# Patient Record
Sex: Female | Born: 1935 | Hispanic: No | Smoking: Former smoker
Health system: Southern US, Community
[De-identification: ages and names within clinical notes are randomized; demographics above are authoritative.]

## PROBLEM LIST (undated history)

## (undated) DIAGNOSIS — K219 Gastro-esophageal reflux disease without esophagitis: Secondary | ICD-10-CM

## (undated) DIAGNOSIS — Z8679 Personal history of other diseases of the circulatory system: Secondary | ICD-10-CM

## (undated) DIAGNOSIS — K279 Peptic ulcer, site unspecified, unspecified as acute or chronic, without hemorrhage or perforation: Secondary | ICD-10-CM

## (undated) DIAGNOSIS — J309 Allergic rhinitis, unspecified: Secondary | ICD-10-CM

## (undated) DIAGNOSIS — J449 Chronic obstructive pulmonary disease, unspecified: Secondary | ICD-10-CM

## (undated) DIAGNOSIS — K2270 Barrett's esophagus without dysplasia: Secondary | ICD-10-CM

## (undated) DIAGNOSIS — E039 Hypothyroidism, unspecified: Secondary | ICD-10-CM

## (undated) DIAGNOSIS — I4891 Unspecified atrial fibrillation: Principal | ICD-10-CM

## (undated) DIAGNOSIS — I10 Essential (primary) hypertension: Secondary | ICD-10-CM

## (undated) DIAGNOSIS — J45909 Unspecified asthma, uncomplicated: Secondary | ICD-10-CM

## (undated) DIAGNOSIS — E785 Hyperlipidemia, unspecified: Secondary | ICD-10-CM

## (undated) DIAGNOSIS — M1990 Unspecified osteoarthritis, unspecified site: Secondary | ICD-10-CM

## (undated) DIAGNOSIS — I2510 Atherosclerotic heart disease of native coronary artery without angina pectoris: Secondary | ICD-10-CM

## (undated) DIAGNOSIS — K8020 Calculus of gallbladder without cholecystitis without obstruction: Secondary | ICD-10-CM

## (undated) HISTORY — DX: Hyperlipidemia, unspecified: E78.5

## (undated) HISTORY — DX: Gastro-esophageal reflux disease without esophagitis: K21.9

## (undated) HISTORY — DX: Allergic rhinitis, unspecified: J30.9

## (undated) HISTORY — DX: Chronic obstructive pulmonary disease, unspecified: J44.9

## (undated) HISTORY — DX: Unspecified asthma, uncomplicated: J45.909

## (undated) HISTORY — PX: BREAST BIOPSY: SHX20

## (undated) HISTORY — PX: CORONARY STENT PLACEMENT: SHX1402

## (undated) HISTORY — PX: CHOLECYSTECTOMY: SHX55

## (undated) HISTORY — DX: Unspecified atrial fibrillation: I48.91

## (undated) HISTORY — DX: Unspecified osteoarthritis, unspecified site: M19.90

## (undated) HISTORY — DX: Atherosclerotic heart disease of native coronary artery without angina pectoris: I25.10

## (undated) HISTORY — PX: APPENDECTOMY: SHX54

## (undated) HISTORY — PX: VESICOVAGINAL FISTULA CLOSURE W/ TAH: SUR271

## (undated) HISTORY — DX: Personal history of other diseases of the circulatory system: Z86.79

## (undated) HISTORY — DX: Calculus of gallbladder without cholecystitis without obstruction: K80.20

## (undated) HISTORY — DX: Hypothyroidism, unspecified: E03.9

## (undated) HISTORY — DX: Peptic ulcer, site unspecified, unspecified as acute or chronic, without hemorrhage or perforation: K27.9

## (undated) HISTORY — DX: Barrett's esophagus without dysplasia: K22.70

## (undated) HISTORY — DX: Essential (primary) hypertension: I10

---

## 2009-10-05 ENCOUNTER — Encounter: Payer: Self-pay | Admitting: Internal Medicine

## 2010-04-14 ENCOUNTER — Ambulatory Visit: Payer: Self-pay | Admitting: Internal Medicine

## 2010-04-14 DIAGNOSIS — J4489 Other specified chronic obstructive pulmonary disease: Secondary | ICD-10-CM | POA: Insufficient documentation

## 2010-04-14 DIAGNOSIS — E039 Hypothyroidism, unspecified: Secondary | ICD-10-CM | POA: Insufficient documentation

## 2010-04-14 DIAGNOSIS — I10 Essential (primary) hypertension: Secondary | ICD-10-CM

## 2010-04-14 DIAGNOSIS — J449 Chronic obstructive pulmonary disease, unspecified: Secondary | ICD-10-CM

## 2010-04-14 DIAGNOSIS — Z8679 Personal history of other diseases of the circulatory system: Secondary | ICD-10-CM

## 2010-04-14 DIAGNOSIS — K219 Gastro-esophageal reflux disease without esophagitis: Secondary | ICD-10-CM | POA: Insufficient documentation

## 2010-04-14 DIAGNOSIS — I4891 Unspecified atrial fibrillation: Secondary | ICD-10-CM | POA: Insufficient documentation

## 2010-04-14 DIAGNOSIS — E785 Hyperlipidemia, unspecified: Secondary | ICD-10-CM

## 2010-04-14 DIAGNOSIS — J309 Allergic rhinitis, unspecified: Secondary | ICD-10-CM | POA: Insufficient documentation

## 2010-04-14 DIAGNOSIS — M1990 Unspecified osteoarthritis, unspecified site: Secondary | ICD-10-CM | POA: Insufficient documentation

## 2010-04-14 DIAGNOSIS — K279 Peptic ulcer, site unspecified, unspecified as acute or chronic, without hemorrhage or perforation: Secondary | ICD-10-CM

## 2010-04-14 DIAGNOSIS — I2510 Atherosclerotic heart disease of native coronary artery without angina pectoris: Secondary | ICD-10-CM

## 2010-04-14 HISTORY — DX: Atherosclerotic heart disease of native coronary artery without angina pectoris: I25.10

## 2010-04-14 HISTORY — DX: Unspecified osteoarthritis, unspecified site: M19.90

## 2010-04-14 HISTORY — DX: Essential (primary) hypertension: I10

## 2010-04-14 HISTORY — DX: Other specified chronic obstructive pulmonary disease: J44.89

## 2010-04-14 HISTORY — DX: Personal history of other diseases of the circulatory system: Z86.79

## 2010-04-14 HISTORY — DX: Peptic ulcer, site unspecified, unspecified as acute or chronic, without hemorrhage or perforation: K27.9

## 2010-04-14 HISTORY — DX: Hyperlipidemia, unspecified: E78.5

## 2010-04-14 HISTORY — DX: Allergic rhinitis, unspecified: J30.9

## 2010-04-14 HISTORY — DX: Chronic obstructive pulmonary disease, unspecified: J44.9

## 2010-04-14 HISTORY — DX: Hypothyroidism, unspecified: E03.9

## 2010-04-14 HISTORY — DX: Unspecified atrial fibrillation: I48.91

## 2010-04-14 HISTORY — DX: Gastro-esophageal reflux disease without esophagitis: K21.9

## 2010-06-06 NOTE — Letter (Signed)
Summary: PATIENT HX FORM  PATIENT HX FORM   Imported By: Georgian Co 04/14/2010 15:26:48  _____________________________________________________________________  External Attachment:    Type:   Image     Comment:   External Document

## 2010-06-06 NOTE — Assessment & Plan Note (Signed)
Summary: NEW PT EST (MEDICARE) // RS   Vital Signs:  Patient profile:   76 year old female Height:      63 inches Weight:      185 pounds BMI:     32.89 Temp:     98.0 degrees F oral BP sitting:   180 / 80  (right arm) Cuff size:   regular  Vitals Entered By: Duard Brady LPN (April 14, 2010 12:57 PM) CC: new to establish -  Is Patient Diabetic? No   CC:  new to establish - .  History of Present Illness: 76 year old patient who is in today to establish with our practice.  She lives in [place], and has an extensive cardiac history.  She has a history of coronary artery disease, status post stenting in 2009.  Apparently, she has paroxysmal H. refer ablation and had been on Coumadin until 2005 when she apparently had a small hemorrhagic stroke.  She has been on aspirin and Plavix since her stenting in 2009.  She states she has a history of ventricular tachycardia and syncope and has been considered for a pacemaker ( probably a ICD)  in the past.  She has arthritis.  Mr. depression, COPD.  She also has a history of peptic ulcer disease and gastroesophageal reflux disease and is on chronic PPI therapy.  This includes omeprazole;  medical regimen.  Also includes diltiazem and 80 mg of simvastatin.  Here for Medicare AWV:  1.   Risk factors based on Past M, S, F history:patient has known coronary artery disease, status post stenting.  She has hypertension and hypercholesterolemia 2.   Physical Activities: no activity restrictions 3.   Depression/mood: no history of depression or mood disorder 4.   Hearing: no deficits 5.   ADL's: independent in all aspects of daily living 6.   Fall Risk: low 7.   Home Safety: no problems identified 8.   Height, weight, &visual acuity:height and weight stable.  No difficulty with visual acuity 9.   Counseling: screening colonoscopy.  Encouraged 10.   Labs ordered based on risk factors: laboratory studies will be ordered in 3 months after  medication adjustment 11.           Referral Coordination- referred to [facility], GI for colonoscopy, and follow-up with [place].  Cardiology 12.           Care Plan- heart healthy diet, salt restricted encouraged 13.            Cognitive Assessment- alert and oriented, with normal affect.  No history of memory dysfunction  Preventive Screening-Counseling & Management  Alcohol-Tobacco     Smoking Status: quit  Allergies (verified): 1)  ! * Novohistine 2)  ! Sulfa 3)  ! Codeine 4)  ! Morphine 5)  ! Erythromycin  Past History:  Past Medical History: Atrial fibrillation COPD Coronary artery disease- status post stenting in February 2009 GERD Hyperlipidemia Hypertension Osteoarthritis Peptic ulcer disease Cerebrovascular accident, hx of small hemorrhagic stroke 2005 Allergic rhinitis Hypothyroidism overactive bladder  Past Surgical History: Appendectomy 1967 Cholecystectomy 1992  breast biopsy 1992 Hysterectomy 1979  flexible sigmoid,greater than  10 years ago colonoscopy never  Family History: Reviewed history and no changes required. father died age 66, MI mother died age 30, congestive heart failure two brothers, both deceased from apparently, pulmonary fibrosis 3 sisters, one deceased from bladder cancer  Social History: Reviewed history and no changes required. Occupation:  retired Scientist, research (physical sciences) [place] tobaccoSmoking Status:  quit  Review of Systems  The patient denies anorexia, fever, weight loss, weight gain, vision loss, decreased hearing, hoarseness, chest pain, syncope, dyspnea on exertion, peripheral edema, prolonged cough, headaches, hemoptysis, abdominal pain, melena, hematochezia, severe indigestion/heartburn, hematuria, incontinence, genital sores, muscle weakness, suspicious skin lesions, transient blindness, difficulty walking, depression, unusual weight change, abnormal bleeding, enlarged lymph nodes, angioedema, and breast masses.     Physical Exam  General:  overweight-appearing.  140/80, rhythm regular Head:  Normocephalic and atraumatic without obvious abnormalities. No apparent alopecia or balding. Eyes:  No corneal or conjunctival inflammation noted. EOMI. Perrla. Funduscopic exam benign, without hemorrhages, exudates or papilledema. Vision grossly normal. Ears:  External ear exam shows no significant lesions or deformities.  Otoscopic examination reveals clear canals, tympanic membranes are intact bilaterally without bulging, retraction, inflammation or discharge. Hearing is grossly normal bilaterally. Nose:  External nasal examination shows no deformity or inflammation. Nasal mucosa are pink and moist without lesions or exudates. Mouth:  Oral mucosa and oropharynx without lesions or exudates.  dentures in place Neck:  No deformities, masses, or tenderness noted. Chest Wall:  No deformities, masses, or tenderness noted. Breasts:  No mass, nodules, thickening, tenderness, bulging, retraction, inflamation, nipple discharge or skin changes noted.   Lungs:  Normal respiratory effort, chest expands symmetrically. Lungs are clear to auscultation, no crackles or wheezes. Heart:  Normal rate and regular rhythm. S1 and S2 normal without gallop, murmur, click, rub or other extra sounds.  rhythm is regular Abdomen:  Bowel sounds positive,abdomen soft and non-tender without masses, organomegaly or hernias noted. Msk:  No deformity or scoliosis noted of thoracic or lumbar spine.   Pulses:  dorsalis pedis pulses.  Full posterior tibia pulses faint Extremities:  No clubbing, cyanosis, edema, or deformity noted with normal full range of motion of all joints.   Neurologic:  No cranial nerve deficits noted. Station and gait are normal. Plantar reflexes are down-going bilaterally. DTRs are symmetrical throughout. Sensory, motor and coordinative functions appear intact. Skin:  Intact without suspicious lesions or rashes Cervical  Nodes:  No lymphadenopathy noted Axillary Nodes:  No palpable lymphadenopathy Inguinal Nodes:  No significant adenopathy Psych:  Cognition and judgment appear intact. Alert and cooperative with normal attention span and concentration. No apparent delusions, illusions, hallucinations   Impression & Recommendations:  Problem # 1:  Preventive Health Care (ICD-V70.0)  Orders: Medicare -1st Annual Wellness Visit [phone])  Complete Medication List: 1)  Cartia Xt 240 Mg Xr24h-cap (Diltiazem hcl coated beads) .... Qd 2)  Cartia Xt 120 Mg Xr24h-cap (Diltiazem hcl coated beads) .... Qd 3)  Diovan 160 Mg Tabs (Valsartan) .... Qd 4)  Levothroid 125 Mcg Tabs (Levothyroxine sodium) .... Once daily 5)  Oxybutynin Chloride 5 Mg Tabs (Oxybutynin chloride) .... Bid 6)  Plavix 75 Mg Tabs (Clopidogrel bisulfate) .... Qd 7)  Vitamin D (ergocalciferol) 50000 Unit Caps (Ergocalciferol) .... Weekly 8)  Flovent Hfa 110 Mcg/act Aero (Fluticasone propionate  hfa) .... 2 puffs bid 9)  Xopenex Hfa 45 Mcg/act Aero (Levalbuterol tartrate) .... 2 puffs qid or prn 10)  Fish Oil 1000 Mg Caps (Omega-3 fatty acids) .... 3 by mouth qd 11)  Calcium 500 Mg Tabs (Calcium) .... 2 by mouth qd 12)  Glucosamine 1500 Complex Caps (Glucosamine-chondroit-vit c-mn) .... Bid 13)  Aspir-low 81 Mg Tbec (Aspirin) .... 2 by mouth qd 14)  Betamethasone Dipropionate 0.05 % Crea (Betamethasone dipropionate) .... Two times a day as needed 15)  Pantoprazole Sodium 20 Mg Tbec (Pantoprazole sodium) .... One  daily 16)  Simvastatin 20 Mg Tabs (Simvastatin) .... One daily  Patient Instructions: 1)  Please schedule a follow-up appointment in 3 months. 2)  Limit your Sodium (Salt) to less than 2 grams a day(slightly less than 1/2 a teaspoon) to prevent fluid retention, swelling, or worsening of symptoms. 3)  It is important that you exercise regularly at least 20 minutes 5 times a week. If you develop chest pain, have severe difficulty breathing,  or feel very tired , stop exercising immediately and seek medical attention. 4)  You need to lose weight. Consider a lower calorie diet and regular exercise.  5)  Schedule a colonoscopy/sigmoidoscopy to help detect colon cancer. 6)  Take calcium +Vitamin D daily. 7)  Take an Aspirin every day. Prescriptions: SIMVASTATIN 20 MG TABS (SIMVASTATIN) one daily  #90 x 6   Entered and Authorized by:   Gordy Savers  MD   Signed by:   Gordy Savers  MD on 04/14/2010   Method used:   Print then Give to Patient   RxID:   [id number] PANTOPRAZOLE SODIUM 20 MG TBEC (PANTOPRAZOLE SODIUM) one daily  #90 x 6   Entered and Authorized by:   Gordy Savers  MD   Signed by:   Gordy Savers  MD on 04/14/2010   Method used:   Print then Give to Patient   RxID:   [id number] QIONGEX HFA 45 MCG/ACT AERO (LEVALBUTEROL TARTRATE) 2 puffs qid or prn  #3 x 6   Entered and Authorized by:   Gordy Savers  MD   Signed by:   Gordy Savers  MD on 04/14/2010   Method used:   Print then Give to Patient   RxID:   [id number] FLOVENT HFA 110 MCG/ACT AERO (FLUTICASONE PROPIONATE  HFA) 2 puffs bid  #3 x 6   Entered and Authorized by:   Gordy Savers  MD   Signed by:   Gordy Savers  MD on 04/14/2010   Method used:   Print then Give to Patient   RxID:   [id number] PLAVIX 75 MG TABS (CLOPIDOGREL BISULFATE) qd  #90 x 7   Entered and Authorized by:   Gordy Savers  MD   Signed by:   Gordy Savers  MD on 04/14/2010   Method used:   Print then Give to Patient   RxID:   [id number] OXYBUTYNIN CHLORIDE 5 MG TABS (OXYBUTYNIN CHLORIDE) bid  #180 x 6   Entered and Authorized by:   Gordy Savers  MD   Signed by:   Gordy Savers  MD on 04/14/2010   Method used:   Print then Give to Patient   RxID:   [id number] LEVOTHROID 125 MCG TABS (LEVOTHYROXINE SODIUM) once daily  #90 x 6   Entered and Authorized by:   Gordy Savers   MD   Signed by:   Gordy Savers  MD on 04/14/2010   Method used:   Print then Give to Patient   RxID:   [id number] DIOVAN 160 MG TABS (VALSARTAN) qd  #90 x 6   Entered and Authorized by:   Gordy Savers  MD   Signed by:   Gordy Savers  MD on 04/14/2010   Method used:   Print then Give to Patient   RxID:   [id number] CARTIA XT 120 MG XR24H-CAP (DILTIAZEM HCL COATED BEADS) qd  #90 x 6   Entered and Authorized by:   Theron Arista  Lysle Dingwall  MD   Signed by:   Gordy Savers  MD on 04/14/2010   Method used:   Print then Give to Patient   RxID:   [id number] CARTIA XT 240 MG XR24H-CAP (DILTIAZEM HCL COATED BEADS) qd  #90 x 6   Entered and Authorized by:   Gordy Savers  MD   Signed by:   Gordy Savers  MD on 04/14/2010   Method used:   Print then Give to Patient   RxID:   [id number]    Orders Added: 1)  Medicare -1st Annual Wellness Visit [G0438] 2)  Est. Patient Level III [13244]   Immunization History:  Pneumovax Immunization History:    Pneumovax:  Historical (03/08/2007)   Immunization History:  Pneumovax Immunization History:    Pneumovax:  Historical (03/08/2007)

## 2010-06-08 NOTE — Letter (Signed)
Summary: Records from [facility] 2009 - 2011  Records from [facility] 2009 - 2011   Imported By: Maryln Gottron 04/21/2010 13:20:29  _____________________________________________________________________  External Attachment:    Type:   Image     Comment:   External Document

## 2010-07-18 ENCOUNTER — Ambulatory Visit (INDEPENDENT_AMBULATORY_CARE_PROVIDER_SITE_OTHER): Payer: Medicare Other | Admitting: Internal Medicine

## 2010-07-18 ENCOUNTER — Encounter: Payer: Self-pay | Admitting: Internal Medicine

## 2010-07-18 DIAGNOSIS — E039 Hypothyroidism, unspecified: Secondary | ICD-10-CM

## 2010-07-18 DIAGNOSIS — I2510 Atherosclerotic heart disease of native coronary artery without angina pectoris: Secondary | ICD-10-CM

## 2010-07-18 DIAGNOSIS — I10 Essential (primary) hypertension: Secondary | ICD-10-CM

## 2010-07-18 DIAGNOSIS — Z0000 Encounter for general adult medical examination without abnormal findings: Secondary | ICD-10-CM

## 2010-07-18 DIAGNOSIS — I4891 Unspecified atrial fibrillation: Secondary | ICD-10-CM

## 2010-07-18 DIAGNOSIS — E785 Hyperlipidemia, unspecified: Secondary | ICD-10-CM

## 2010-07-18 LAB — BASIC METABOLIC PANEL
BUN: 12 mg/dL (ref 6–23)
CO2: 24 mEq/L (ref 19–32)
Calcium: 9.5 mg/dL (ref 8.4–10.5)
Chloride: 104 mEq/L (ref 96–112)
Creatinine, Ser: 0.8 mg/dL (ref 0.4–1.2)
Glucose, Bld: 107 mg/dL — ABNORMAL HIGH (ref 70–99)

## 2010-07-18 LAB — CBC WITH DIFFERENTIAL/PLATELET
Basophils Absolute: 0 10*3/uL (ref 0.0–0.1)
Basophils Relative: 0.3 % (ref 0.0–3.0)
Eosinophils Absolute: 0.3 10*3/uL (ref 0.0–0.7)
Lymphocytes Relative: 29 % (ref 12.0–46.0)
MCHC: 34.1 g/dL (ref 30.0–36.0)
MCV: 88.3 fl (ref 78.0–100.0)
Monocytes Absolute: 0.8 10*3/uL (ref 0.1–1.0)
Neutrophils Relative %: 58.4 % (ref 43.0–77.0)
Platelets: 302 10*3/uL (ref 150.0–400.0)
RDW: 13.2 % (ref 11.5–14.6)

## 2010-07-18 LAB — HEPATIC FUNCTION PANEL
Alkaline Phosphatase: 85 U/L (ref 39–117)
Bilirubin, Direct: 0.1 mg/dL (ref 0.0–0.3)
Total Protein: 6.7 g/dL (ref 6.0–8.3)

## 2010-07-18 MED ORDER — OXYBUTYNIN CHLORIDE 5 MG PO TABS: 5.0000 mg | ORAL_TABLET | Freq: Two times a day (BID) | ORAL | Status: DC

## 2010-07-18 NOTE — Progress Notes (Signed)
  Subjective:    Patient ID: Tonya Willis, female    DOB: Dec 22, 1934, 76 y.o.   MRN: [id number]  HPI   76 year old patient who has a history of coronary artery disease status post stenting in February of 2009. She has been followed by a [place] cardiology and wishes to be followed here locally. She has a history of paroxysmal atrial  fibrillation. She describes frequent episodes of weakness dizziness and near syncope.   She states that she has had this problem before and has been considered for a pacemaker. Denies any palpitations. She states that she has a history of anemia that has precipitated these symptoms also in the past she has a history of peptic ulcer disease. She states that she has had a stress echo and carotid artery ultrasound performed over the past year. Denies any exertional chest pain.    Review of Systems  Constitutional: Negative.   HENT: Negative for hearing loss, congestion, sore throat, rhinorrhea, dental problem, sinus pressure and tinnitus.   Eyes: Negative for pain, discharge and visual disturbance.  Respiratory: Negative for cough and shortness of breath.   Cardiovascular: Negative for chest pain, palpitations and leg swelling.  Gastrointestinal: Negative for nausea, vomiting, abdominal pain, diarrhea, constipation, blood in stool and abdominal distention.  Genitourinary: Negative for dysuria, urgency, frequency, hematuria, flank pain, vaginal bleeding, vaginal discharge, difficulty urinating, vaginal pain and pelvic pain.  Musculoskeletal: Negative for joint swelling, arthralgias and gait problem.  Skin: Negative for rash.  Neurological: Positive for light-headedness. Negative for dizziness, syncope, speech difficulty, weakness, numbness and headaches.  Hematological: Negative for adenopathy.  Psychiatric/Behavioral: Negative for behavioral problems, dysphoric mood and agitation. The patient is not nervous/anxious.        Objective:   Physical Exam    Constitutional: She is oriented to person, place, and time. She appears well-developed and well-nourished. No distress.        Blood pressure 110/70. Pulse regular  HENT:  Head: Normocephalic.  Right Ear: External ear normal.  Left Ear: External ear normal.  Mouth/Throat: Oropharynx is clear and moist.  Eyes: Conjunctivae and EOM are normal. Pupils are equal, round, and reactive to light.  Neck: Normal range of motion. Neck supple. No thyromegaly present.  Cardiovascular: Normal rate, regular rhythm, normal heart sounds and intact distal pulses.   Pulmonary/Chest: Effort normal and breath sounds normal.  Abdominal: Soft. Bowel sounds are normal. She exhibits no mass. There is no tenderness.  Musculoskeletal: Normal range of motion.  Lymphadenopathy:    She has no cervical adenopathy.  Neurological: She is alert and oriented to person, place, and time.  Skin: Skin is warm and dry. No rash noted.  Psychiatric: She has a normal mood and affect. Her behavior is normal.          Assessment & Plan:   dizziness and episodes of near syncope-  We'll  Decrease diltiazem to 240 mg daily. We'll set up for a cardiology evaluation. Consider event monitor.  Coronary artery disease  Dyslipidemia  Hypothyroidism   We'll obtain the laboratory update  Refer to cardiology  Will need colonoscopy when felt to be clinically stable

## 2010-07-18 NOTE — Patient Instructions (Signed)
Cardiology referral as discussed   Decrease diltiazem to 240 mg daily  Limit your sodium (Salt) intake    It is important that you exercise regularly, at least 20 minutes 3 to 4 times per week.  If you develop chest pain or shortness of breath seek  medical attention.  Take a calcium supplement, plus [phone] units of vitamin D  Schedule your colonoscopy to help detect colon cancer.  Return in 3 months for follow-up

## 2010-07-20 ENCOUNTER — Emergency Department (HOSPITAL_COMMUNITY)
Admission: EM | Admit: 2010-07-20 | Discharge: 2010-07-20 | Disposition: A | Discharge status: Home / Self Care | Payer: Medicare Other | Attending: Emergency Medicine | Admitting: Emergency Medicine

## 2010-07-20 ENCOUNTER — Telehealth: Payer: Self-pay | Admitting: Internal Medicine

## 2010-07-20 ENCOUNTER — Emergency Department (HOSPITAL_COMMUNITY): Payer: Medicare Other

## 2010-07-20 DIAGNOSIS — I10 Essential (primary) hypertension: Secondary | ICD-10-CM | POA: Insufficient documentation

## 2010-07-20 DIAGNOSIS — N390 Urinary tract infection, site not specified: Secondary | ICD-10-CM | POA: Insufficient documentation

## 2010-07-20 DIAGNOSIS — R609 Edema, unspecified: Secondary | ICD-10-CM | POA: Insufficient documentation

## 2010-07-20 DIAGNOSIS — Z79899 Other long term (current) drug therapy: Secondary | ICD-10-CM | POA: Insufficient documentation

## 2010-07-20 DIAGNOSIS — R5381 Other malaise: Secondary | ICD-10-CM | POA: Insufficient documentation

## 2010-07-20 DIAGNOSIS — R5383 Other fatigue: Secondary | ICD-10-CM | POA: Insufficient documentation

## 2010-07-20 DIAGNOSIS — R0602 Shortness of breath: Secondary | ICD-10-CM | POA: Insufficient documentation

## 2010-07-20 DIAGNOSIS — R55 Syncope and collapse: Secondary | ICD-10-CM | POA: Insufficient documentation

## 2010-07-20 DIAGNOSIS — Z7982 Long term (current) use of aspirin: Secondary | ICD-10-CM | POA: Insufficient documentation

## 2010-07-20 LAB — POCT CARDIAC MARKERS
CKMB, poc: 1 ng/mL (ref 1.0–8.0)
Myoglobin, poc: 62.6 ng/mL (ref 12–200)
Troponin i, poc: <0.05 ng/mL (ref 0.00–0.09)

## 2010-07-20 LAB — POCT I-STAT, CHEM 8
BUN: 16 mg/dL (ref 6–23)
Calcium, Ion: 1.09 mmol/L — ABNORMAL LOW (ref 1.12–1.32)
Chloride: 109 mEq/L (ref 96–112)
Creatinine, Ser: 0.8 mg/dL (ref 0.4–1.2)
Glucose, Bld: 92 mg/dL (ref 70–99)
HCT: 47 % — ABNORMAL HIGH (ref 36.0–46.0)
Hemoglobin: 16 g/dL — ABNORMAL HIGH (ref 12.0–15.0)
Potassium: 3.8 mEq/L (ref 3.5–5.1)
Sodium: 141 meq/L (ref 135–145)
TCO2: 23 mmol/L (ref 0–100)

## 2010-07-20 LAB — CBC
MCHC: 33.2 g/dL (ref 30.0–36.0)
Platelets: 308 10*3/uL (ref 150–400)
RDW: 13.3 % (ref 11.5–15.5)

## 2010-07-20 LAB — DIFFERENTIAL
Basophils Absolute: 0 10*3/uL (ref 0.0–0.1)
Basophils Relative: 0 % (ref 0–1)
Eosinophils Relative: 2 % (ref 0–5)
Monocytes Absolute: 0.9 10*3/uL (ref 0.1–1.0)

## 2010-07-20 LAB — URINALYSIS, ROUTINE W REFLEX MICROSCOPIC
Bilirubin Urine: NEGATIVE
Glucose, UA: NEGATIVE mg/dL
Ketones, ur: NEGATIVE mg/dL
pH: 7.5 (ref 5.0–8.0)

## 2010-07-20 LAB — URINE MICROSCOPIC-ADD ON

## 2010-07-20 NOTE — Telephone Encounter (Signed)
Spoke with pt - instructed to go directly to ER at [facility] to be eval. Per Dr. Amador Cunas - son's girlfriend with her and she will drive her. kik

## 2010-07-20 NOTE — Telephone Encounter (Signed)
Pt called and said that she passed out 3 times last night. Pt is req that the referral to cardiologist be expedited asap today. Pls call pt on cell if she can not be reached at home.

## 2010-07-20 NOTE — Telephone Encounter (Signed)
PLEASE ADVISE.

## 2010-07-20 NOTE — Telephone Encounter (Signed)
Please notify patient that she should report to [facility] emergency room immediately for admission for evaluation and cardiology consultation

## 2010-07-22 LAB — URINE CULTURE: Colony Count: >=100000

## 2010-07-25 ENCOUNTER — Encounter: Payer: Self-pay | Admitting: Internal Medicine

## 2010-07-25 ENCOUNTER — Ambulatory Visit (INDEPENDENT_AMBULATORY_CARE_PROVIDER_SITE_OTHER): Payer: Medicare Other | Admitting: Internal Medicine

## 2010-07-25 DIAGNOSIS — I2510 Atherosclerotic heart disease of native coronary artery without angina pectoris: Secondary | ICD-10-CM

## 2010-07-25 DIAGNOSIS — I4891 Unspecified atrial fibrillation: Secondary | ICD-10-CM

## 2010-07-25 DIAGNOSIS — I10 Essential (primary) hypertension: Secondary | ICD-10-CM

## 2010-07-25 DIAGNOSIS — N390 Urinary tract infection, site not specified: Secondary | ICD-10-CM

## 2010-07-25 NOTE — Progress Notes (Signed)
  Subjective:    Patient ID: Tonya Willis, female    DOB: Nov 20, 1934, 76 y.o.   MRN: [id number]  HPI   76 year old patient who is seen following an ER visit 5 days ago. She presented with weakness near syncope and was treated for a UTI. She does have a history of coronary artery disease and atrophic relation. She is completing Keflex 500 mg 4 times a day. She feels much improved and back to baseline. Denies any genitourinary symptoms. ER records were reviewed a laboratory evaluation was unremarkable except for pyuria;  she had other labs and a chest x-ray.    Review of Systems  Constitutional: Positive for fatigue.  HENT: Negative for hearing loss, congestion, sore throat, rhinorrhea, dental problem, sinus pressure and tinnitus.   Eyes: Negative for pain, discharge and visual disturbance.  Respiratory: Negative for cough and shortness of breath.   Cardiovascular: Negative for chest pain, palpitations and leg swelling.  Gastrointestinal: Negative for nausea, vomiting, abdominal pain, diarrhea, constipation, blood in stool and abdominal distention.  Genitourinary: Negative for dysuria, urgency, frequency, hematuria, flank pain, vaginal bleeding, vaginal discharge, difficulty urinating, vaginal pain and pelvic pain.  Musculoskeletal: Negative for joint swelling, arthralgias and gait problem.  Skin: Negative for rash.  Neurological: Negative for dizziness, syncope, speech difficulty, weakness, numbness and headaches.  Hematological: Negative for adenopathy.  Psychiatric/Behavioral: Negative for behavioral problems, dysphoric mood and agitation. The patient is not nervous/anxious.        Objective:   Physical Exam  Constitutional: She is oriented to person, place, and time. She appears well-developed and well-nourished. No distress.  HENT:  Head: Normocephalic.  Right Ear: External ear normal.  Left Ear: External ear normal.  Mouth/Throat: Oropharynx is clear and moist.  Eyes:  Conjunctivae and EOM are normal. Pupils are equal, round, and reactive to light.  Neck: Normal range of motion. Neck supple. No thyromegaly present.  Cardiovascular: Normal rate, regular rhythm, normal heart sounds and intact distal pulses.   Pulmonary/Chest: Effort normal and breath sounds normal.        O2 saturation 97%  Abdominal: Soft. Bowel sounds are normal. She exhibits no mass. There is no tenderness.  Musculoskeletal: Normal range of motion.  Lymphadenopathy:    She has no cervical adenopathy.  Neurological: She is alert and oriented to person, place, and time.  Skin: Skin is warm and dry. No rash noted.  Psychiatric: She has a normal mood and affect. Her behavior is normal.          Assessment & Plan:   status post UTI with near syncope- we'll complete antibiotic therapy. We'll see in 3 months as originally scheduled.  Hypertension stable  Atrial fibrillation stable

## 2010-07-25 NOTE — Patient Instructions (Signed)
Return in 3 months for follow-up  Call or return to clinic prn if these symptoms worsen or fail to improve as anticipated.  

## 2010-08-02 ENCOUNTER — Encounter: Payer: Self-pay | Admitting: Cardiovascular Disease

## 2010-08-16 ENCOUNTER — Encounter: Payer: Self-pay | Admitting: Cardiovascular Disease

## 2010-08-16 ENCOUNTER — Ambulatory Visit (INDEPENDENT_AMBULATORY_CARE_PROVIDER_SITE_OTHER): Payer: Medicare Other | Admitting: Cardiovascular Disease

## 2010-08-16 VITALS — BP 152/74 | HR 66 | Resp 18 | Ht 63.0 in | Wt 185.8 lb

## 2010-08-16 DIAGNOSIS — I4891 Unspecified atrial fibrillation: Secondary | ICD-10-CM

## 2010-08-16 DIAGNOSIS — R0609 Other forms of dyspnea: Secondary | ICD-10-CM

## 2010-08-16 DIAGNOSIS — I2510 Atherosclerotic heart disease of native coronary artery without angina pectoris: Secondary | ICD-10-CM

## 2010-08-16 DIAGNOSIS — R0600 Dyspnea, unspecified: Secondary | ICD-10-CM

## 2010-08-16 MED ORDER — NITROGLYCERIN 0.4 MG SL SUBL: 0.4000 mg | SUBLINGUAL_TABLET | SUBLINGUAL | Status: DC | PRN

## 2010-08-16 NOTE — Assessment & Plan Note (Signed)
Will get echo to get current assessment of LVEF.

## 2010-08-16 NOTE — Progress Notes (Signed)
History of Present Illness: 76 yo WF with history of CAD with previous stenting 2009, atrial fibrillation, CVA, ventricular  Tachycardia, OA, COPD, depression and is here today to establish cardiac care. She tells me that she has been followed in [facility] by Dr. Lilian Coma but she wishes to change. Her initial cardiac procedures were done in [facility] by [facility]. She had a stent placed in 2009 (Endeavor 3.0 x 12 mm ? Vessel---stent card has "left coronary artery circled".              She was on coumadin until 2005 when she had a small hemorrhagic stroke and it was stopped. She has been on ASA and Plavix over the last three years. She had a nuclear stress test and echo  In 2011 in [facility]. She thinks her stress test was ok. She was told she had enlargement of both atria on the echo. She also wore a heart monitor which showed ventricular tachycardia. She was admitted to [facility] and underwent a cardiac cath. Her family was told that she might need a pacemaker (? ICD). This was in May of 2011. Since then, she has been dyspneic. She denies any chest pain. She does endorse lower ext edema. She went to the [facility] ED in march 2012 with dizziness and syncope and was found to have a UTI. It was treated and she feels much better.   She has not used her NTG.    Past Medical History  Diagnosis Date  . ALLERGIC RHINITIS 04/14/2010  . Atrial fibrillation 04/14/2010  . CEREBROVASCULAR ACCIDENT, HX OF 04/14/2010  . COPD 04/14/2010  . CORONARY ARTERY DISEASE 04/14/2010  . GERD 04/14/2010  . HYPERLIPIDEMIA 04/14/2010  . HYPERTENSION 04/14/2010  . HYPOTHYROIDISM 04/14/2010  . OSTEOARTHRITIS 04/14/2010  . PEPTIC ULCER DISEASE 04/14/2010    Past Surgical History  Procedure Date  . Appendectomy   . Cholecystectomy   . Breast biopsy     Current Outpatient Prescriptions  Medication Sig Dispense Refill  . aspirin 81 MG tablet Take 81 mg by mouth daily. 2 pills daily       . betamethasone dipropionate (DIPROLENE) 0.05 % cream Apply topically 2 (two) times daily as needed.        . Calcium Carbonate (CALCIUM 500 PO) Take 2 tablets by mouth daily.        . clopidogrel (PLAVIX) 75 MG tablet Take 75 mg by mouth daily.        Marland Kitchen diltiazem (CARDIZEM CD) 240 MG 24 hr capsule Take 240 mg by mouth daily.        . diphenhydrAMINE (SOMINEX) 25 MG tablet Take 25 mg by mouth at bedtime as needed.        . ergocalciferol (VITAMIN D2) 50000 UNITS capsule Take 50,000 Units by mouth once a week.        . fluticasone (FLOVENT HFA) 110 MCG/ACT inhaler Inhale 2 puffs into the lungs 2 (two) times daily.        . Glucosamine-Chondroit-Vit C-Mn (GLUCOSAMINE 1500 COMPLEX PO) Take 1 tablet by mouth 2 (two) times daily.        Marland Kitchen levalbuterol (XOPENEX HFA) 45 MCG/ACT inhaler Inhale 1-2 puffs into the lungs every 4 (four) hours as needed.        Marland Kitchen levothyroxine (SYNTHROID, LEVOTHROID) 125 MCG tablet Take 125 mcg by mouth daily.        . Omega-3 Fatty Acids (FISH OIL) 1000 MG CAPS Take 1  capsule by mouth 3 (three) times daily.       Marland Kitchen oxybutynin (DITROPAN) 5 MG tablet Take 1 tablet (5 mg total) by mouth 2 (two) times daily.  90 tablet  6  . pantoprazole (PROTONIX) 20 MG tablet Take 20 mg by mouth daily.        . valsartan (DIOVAN) 160 MG tablet Take 160 mg by mouth daily.        Marland Kitchen DISCONTD: cephALEXin (KEFLEX) 500 MG capsule Take 500 mg by mouth 4 (four) times daily.          Allergies  Allergen Reactions  . Codeine     REACTION: hallucinate  . Erythromycin     REACTION: hives  . Morphine   . Sulfonamide Derivatives     REACTION: SOB    History   Social History  . Marital Status: Widowed    Spouse Name: N/A    Number of Children: N/A  . Years of Education: N/A   Occupational History  . Not on file.   Social History Main Topics  . Smoking status: Former Smoker    Quit date: 05/08/1991  . Smokeless tobacco: Never Used  . Alcohol Use: No  . Drug Use: No  . Sexually  Active: Not on file   Other Topics Concern  . Not on file   Social History Narrative   Occupation:  retired CNAWidow/Widower    Family History  Problem Relation Age of Onset  . Heart attack    . Heart failure    . Pulmonary fibrosis    . Cancer      Review of Systems:  As stated in the HPI and otherwise negative.   BP 152/74  Pulse 66  Resp 18  Ht 5\' 3"  (1.6 m)  Wt 185 lb 12.8 oz (84.278 kg)  BMI 32.91 kg/m2  Physical Examination: General: Well developed, well nourished, NAD HEENT: OP clear, mucus membranes moist SKIN: warm, dry. No rashes. Neuro: No focal deficits Musculoskeletal: Muscle strength 5/5 all ext Psychiatric: Mood and affect normal Neck: No JVD, no carotid bruits, no thyromegaly, no lymphadenopathy. Lungs:Clear bilaterally, no wheezes, rhonci, crackles Cardiovascular: Regular rate and rhythm. No murmurs, gallops or rubs. Abdomen:Soft. Bowel sounds present. Non-tender.  Extremities: No lower extremity edema. Pulses are 2 + in the bilateral DP/PT.  EKG: Sinus rhythm, rate 66 bpm.

## 2010-08-16 NOTE — Assessment & Plan Note (Addendum)
Stable. Continue ASA,  Plavix and statin (Simvastatin 20 mg po QHS). All records requested from [facility].

## 2010-08-16 NOTE — Patient Instructions (Signed)
Your physician recommends that you schedule a follow-up appointment in: 3-4 weeks with Dr. Clifton James  Your physician has requested that you have an echocardiogram. Echocardiography is a painless test that uses sound waves to create images of your heart. It provides your doctor with information about the size and shape of your heart and how well your heart's chambers and valves are working. This procedure takes approximately one hour. There are no restrictions for this procedure.

## 2010-08-16 NOTE — Assessment & Plan Note (Addendum)
Sinus rhythm today. She has not been on coumadin because of a prior hemorrhagic CVA.

## 2010-08-17 ENCOUNTER — Telehealth: Payer: Self-pay | Admitting: Cardiovascular Disease

## 2010-08-17 NOTE — Telephone Encounter (Signed)
ROI faxed to [facility] @ [phone] 08/17/10/KM

## 2010-08-17 NOTE — Telephone Encounter (Signed)
Records received from [facility] gave to [facility] 08/17/10/KM

## 2010-08-28 ENCOUNTER — Ambulatory Visit (HOSPITAL_COMMUNITY): Payer: Medicare Other | Attending: Cardiovascular Disease | Admitting: Radiology

## 2010-08-28 DIAGNOSIS — I10 Essential (primary) hypertension: Secondary | ICD-10-CM | POA: Insufficient documentation

## 2010-08-28 DIAGNOSIS — I2510 Atherosclerotic heart disease of native coronary artery without angina pectoris: Secondary | ICD-10-CM

## 2010-08-28 DIAGNOSIS — R0600 Dyspnea, unspecified: Secondary | ICD-10-CM

## 2010-08-28 DIAGNOSIS — I059 Rheumatic mitral valve disease, unspecified: Secondary | ICD-10-CM | POA: Insufficient documentation

## 2010-08-28 DIAGNOSIS — I4891 Unspecified atrial fibrillation: Secondary | ICD-10-CM

## 2010-08-28 DIAGNOSIS — I079 Rheumatic tricuspid valve disease, unspecified: Secondary | ICD-10-CM | POA: Insufficient documentation

## 2010-08-28 DIAGNOSIS — E785 Hyperlipidemia, unspecified: Secondary | ICD-10-CM | POA: Insufficient documentation

## 2010-09-07 ENCOUNTER — Encounter: Payer: Self-pay | Admitting: Cardiovascular Disease

## 2010-09-07 ENCOUNTER — Ambulatory Visit (INDEPENDENT_AMBULATORY_CARE_PROVIDER_SITE_OTHER): Payer: Medicare Other | Admitting: Cardiovascular Disease

## 2010-09-07 VITALS — BP 142/70 | HR 72 | Resp 18 | Ht 63.0 in | Wt 184.4 lb

## 2010-09-07 DIAGNOSIS — R42 Dizziness and giddiness: Secondary | ICD-10-CM

## 2010-09-07 DIAGNOSIS — R0609 Other forms of dyspnea: Secondary | ICD-10-CM

## 2010-09-07 DIAGNOSIS — I2510 Atherosclerotic heart disease of native coronary artery without angina pectoris: Secondary | ICD-10-CM

## 2010-09-07 DIAGNOSIS — I4891 Unspecified atrial fibrillation: Secondary | ICD-10-CM

## 2010-09-07 DIAGNOSIS — R0600 Dyspnea, unspecified: Secondary | ICD-10-CM

## 2010-09-07 NOTE — Assessment & Plan Note (Addendum)
Stable. No changes. She is on ASA and Plavix. She is not on a beta blocker. The reason is not clear to me. She is also not on a statin. We will discuss initiating statin therapy and potentially a beta blocker at the f/u visit. Her cardiac issues have been taken care of in the [facility] until recently.

## 2010-09-07 NOTE — Assessment & Plan Note (Signed)
This does not appear to be cardiac. Her LV function is normal.

## 2010-09-07 NOTE — Patient Instructions (Signed)
Your physician has recommended that you wear a 48 hour holter monitor. Holter monitors are medical devices that record the heart's electrical activity. Doctors most often use these monitors to diagnose arrhythmias. Arrhythmias are problems with the speed or rhythm of the heartbeat. The monitor is a small, portable device. You can wear one while you do your normal daily activities. This is usually used to diagnose what is causing palpitations/syncope (passing out).  Your physician has requested that you have a carotid duplex. This test is an ultrasound of the carotid arteries in your neck. It looks at blood flow through these arteries that supply the brain with blood. Allow one hour for this exam. There are no restrictions or special instructions.  Your physician recommends that you schedule a follow-up appointment in: 1 month.  Your physician recommends that you continue on your current medications as directed. Please refer to the Current Medication list given to you today.

## 2010-09-07 NOTE — Assessment & Plan Note (Signed)
This is most likely non-cardiac. I will arrange carotid artery dopplers to exclude disease. I will also have her wear a 48 hour Holter monitor to exclude bradycardia, heart block and other arrythmias.

## 2010-09-07 NOTE — Progress Notes (Signed)
History of Present Illness:75 yo WF with history of CAD with previous stenting 2009, atrial fibrillation, CVA, ventricular Tachycardia, OA, COPD, depression and is here today for cardiac follow up. I saw her as a new patient 3 weeks ago.  She told me that she has been followed in [facility] by Dr. Lilian Coma but she wishes to change. Her initial cardiac procedures were done in [facility] by [facility]. She had a stent placed in 2009 (Endeavor 3.0 x 12 mm ? Vessel---stent card has "left coronary artery circled". She was on coumadin until 2005 when she had a small hemorrhagic stroke and it was stopped. She has been on ASA and Plavix over the last three years. She had a nuclear stress test and echo In 2011 in [facility]. She thinks her stress test was ok. She was told she had enlargement of both atria on the echo. She also wore a heart monitor which showed ventricular tachycardia. She was admitted to [facility] and underwent a cardiac cath. Her family was told that she might need a pacemaker (? ICD). This was in May of 2011. Since then, she has been dyspneic. She denies any chest pain. She does endorse lower ext edema. She went to the [facility] ED in march 2012 with dizziness and syncope and was found to have a UTI.  I ordered an echo. Her echo showed normal LV function. PAP mildly elevated. No vavlular issues. (see below)  She still feels like she may pass out when she is doing housework. She feels weak and dizzy with standing. No fever, chills, cough. She does endorse SOB. No chest pain. She had bloodwork with Dr. Lesia Hausen in march 2012 with normal H/H, renal function and TSH. She had syncope and CP before her stent was placed in 2009. No palpitations or awareness of irregularity of her heart rhythm. As above, recent normal stress test at other office.  Her primary care is Dr. Lesia Hausen.   Past Medical History  Diagnosis Date  . ALLERGIC RHINITIS 04/14/2010  . Atrial  fibrillation 04/14/2010  . CEREBROVASCULAR ACCIDENT, HX OF 04/14/2010  . COPD 04/14/2010  . CORONARY ARTERY DISEASE 04/14/2010  . GERD 04/14/2010  . HYPERLIPIDEMIA 04/14/2010  . HYPERTENSION 04/14/2010  . HYPOTHYROIDISM 04/14/2010  . OSTEOARTHRITIS 04/14/2010  . PEPTIC ULCER DISEASE 04/14/2010    Past Surgical History  Procedure Date  . Appendectomy   . Cholecystectomy   . Breast biopsy     Current Outpatient Prescriptions  Medication Sig Dispense Refill  . aspirin 81 MG tablet Take 81 mg by mouth daily. 2 pills daily      . betamethasone dipropionate (DIPROLENE) 0.05 % cream Apply topically 2 (two) times daily as needed.        . Calcium Carbonate (CALCIUM 500 PO) Take 2 tablets by mouth daily.        . clopidogrel (PLAVIX) 75 MG tablet Take 75 mg by mouth daily.        Marland Kitchen diltiazem (CARDIZEM CD) 240 MG 24 hr capsule Take 240 mg by mouth daily.        . diphenhydrAMINE (SOMINEX) 25 MG tablet Take 25 mg by mouth at bedtime as needed.        . ergocalciferol (VITAMIN D2) 50000 UNITS capsule Take 50,000 Units by mouth once a week.        . fluticasone (FLOVENT HFA) 110 MCG/ACT inhaler Inhale 2 puffs into the lungs 2 (two) times daily.        Marland Kitchen  Glucosamine-Chondroit-Vit C-Mn (GLUCOSAMINE 1500 COMPLEX PO) Take 1 tablet by mouth 2 (two) times daily.        Marland Kitchen levalbuterol (XOPENEX HFA) 45 MCG/ACT inhaler Inhale 1-2 puffs into the lungs every 4 (four) hours as needed.        Marland Kitchen levothyroxine (SYNTHROID, LEVOTHROID) 125 MCG tablet Take 125 mcg by mouth daily.        . nitroGLYCERIN (NITROSTAT) 0.4 MG SL tablet Place 1 tablet (0.4 mg total) under the tongue every 5 (five) minutes as needed for chest pain.  25 tablet  3  . Omega-3 Fatty Acids (FISH OIL) 1000 MG CAPS Take 1 capsule by mouth 3 (three) times daily.       Marland Kitchen oxybutynin (DITROPAN) 5 MG tablet Take 1 tablet (5 mg total) by mouth 2 (two) times daily.  90 tablet  6  . pantoprazole (PROTONIX) 20 MG tablet Take 20 mg by mouth daily.        .  valsartan (DIOVAN) 160 MG tablet Take 160 mg by mouth daily.          Allergies  Allergen Reactions  . Codeine     REACTION: hallucinate  . Erythromycin     REACTION: hives  . Morphine   . Sulfonamide Derivatives     REACTION: SOB    History   Social History  . Marital Status: Widowed    Spouse Name: N/A    Number of Children: N/A  . Years of Education: N/A   Occupational History  . Not on file.   Social History Main Topics  . Smoking status: Former Smoker    Quit date: 05/08/1991  . Smokeless tobacco: Never Used  . Alcohol Use: No  . Drug Use: No  . Sexually Active: Not on file   Other Topics Concern  . Not on file   Social History Narrative   Occupation:  retired CNAWidow/Widower    Family History  Problem Relation Age of Onset  . Heart attack    . Heart failure    . Pulmonary fibrosis    . Cancer    . Heart failure Mother   . Heart disease Father     first MI age 52s    Review of Systems:  As stated in the HPI and otherwise negative.   BP 142/70  Pulse 72  Resp 18  Ht 5\' 3"  (1.6 m)  Wt 184 lb 6.4 oz (83.643 kg)  BMI 32.66 kg/m2  Physical Examination: General: Well developed, well nourished, NAD HEENT: OP clear, mucus membranes moist SKIN: warm, dry. No rashes. Neuro: No focal deficits Musculoskeletal: Muscle strength 5/5 all ext Psychiatric: Mood and affect normal Neck: No JVD, no carotid bruits, no thyromegaly, no lymphadenopathy. Lungs:Clear bilaterally, no wheezes, rhonci, crackles Cardiovascular: Regular rate and rhythm. No murmurs, gallops or rubs. Abdomen:Soft. Bowel sounds present. Non-tender.  Extremities: No lower extremity edema. Pulses are 2 + in the bilateral DP/PT.  Echo 08/28/10: Left ventricle: The cavity size was normal. Systolic function was normal. Wall motion was normal; there were no regional wall motion abnormalities. - Left atrium: The atrium was mildly dilated. - Atrial septum: No defect or patent foramen ovale was  identified. - Pulmonary arteries: PA peak pressure: 42mm Hg (S).

## 2010-09-15 ENCOUNTER — Encounter (INDEPENDENT_AMBULATORY_CARE_PROVIDER_SITE_OTHER): Payer: Medicare Other

## 2010-09-15 ENCOUNTER — Encounter (INDEPENDENT_AMBULATORY_CARE_PROVIDER_SITE_OTHER): Payer: Medicare Other | Admitting: *Deleted

## 2010-09-15 ENCOUNTER — Other Ambulatory Visit: Payer: Self-pay | Admitting: *Deleted

## 2010-09-15 DIAGNOSIS — R42 Dizziness and giddiness: Secondary | ICD-10-CM

## 2010-09-20 ENCOUNTER — Encounter: Payer: Self-pay | Admitting: Cardiovascular Disease

## 2010-09-21 ENCOUNTER — Telehealth: Payer: Self-pay | Admitting: Cardiovascular Disease

## 2010-09-21 DIAGNOSIS — I471 Supraventricular tachycardia: Secondary | ICD-10-CM

## 2010-09-21 MED ORDER — METOPROLOL SUCCINATE ER 25 MG PO TB24: 25.0000 mg | ORAL_TABLET | Freq: Every day | ORAL | Status: DC

## 2010-09-21 NOTE — Telephone Encounter (Signed)
Patient aware of monitor results. Dr. Clifton James reviewed the monitor and it showed that Ms. Tonya Willis had a few episodes of SVT. She will start on Toprol 25 mg daily and f/u with Dr. Clifton James 10/04/10.

## 2010-09-21 NOTE — Telephone Encounter (Signed)
Pt wants the results of her event monitor and would like to talk to a nurse,

## 2010-09-21 NOTE — Telephone Encounter (Signed)
Patient aware of carotid ultrasound results.  

## 2010-09-21 NOTE — Telephone Encounter (Signed)
Pt is returning whitney call.

## 2010-10-03 ENCOUNTER — Encounter: Payer: Self-pay | Admitting: Gastroenterology

## 2010-10-04 ENCOUNTER — Encounter: Payer: Self-pay | Admitting: Cardiovascular Disease

## 2010-10-04 ENCOUNTER — Ambulatory Visit (INDEPENDENT_AMBULATORY_CARE_PROVIDER_SITE_OTHER): Payer: Medicare Other | Admitting: Cardiovascular Disease

## 2010-10-04 VITALS — BP 126/98 | HR 58 | Resp 18 | Ht 63.0 in | Wt 186.1 lb

## 2010-10-04 DIAGNOSIS — I2510 Atherosclerotic heart disease of native coronary artery without angina pectoris: Secondary | ICD-10-CM

## 2010-10-04 DIAGNOSIS — R609 Edema, unspecified: Secondary | ICD-10-CM | POA: Insufficient documentation

## 2010-10-04 DIAGNOSIS — I10 Essential (primary) hypertension: Secondary | ICD-10-CM

## 2010-10-04 MED ORDER — VALSARTAN-HYDROCHLOROTHIAZIDE 160-25 MG PO TABS: 1.0000 | ORAL_TABLET | Freq: Every day | ORAL | Status: DC

## 2010-10-04 NOTE — Assessment & Plan Note (Signed)
Resolved. Most likely non-cardiac.

## 2010-10-04 NOTE — Patient Instructions (Signed)
Your physician recommends that you schedule a follow-up appointment in: 6 MONTHS  Your physician has recommended you make the following change in your medication: STOP DIOVAN and START DIOVAN-HCT 160/25 mg daily.

## 2010-10-04 NOTE — Assessment & Plan Note (Signed)
Resolved. Carotid dopplers with minimal disease. No evidence of malignant arrhytmias. No evidence of heart block. No further w/u.

## 2010-10-04 NOTE — Progress Notes (Signed)
History of Present Illness:75 yo WF with history of CAD with previous stenting 2009, atrial fibrillation, CVA, ventricular tachycardia, OA, COPD, depression and is here today for cardiac follow up. I saw her as a new patient in April 2012.. She told me that she has been followed in [facility] by Dr. Lilian Coma but she wishes to change. Her initial cardiac procedures were done in [facility] by [facility]. She had a stent placed in 2009 (Endeavor 3.0 x 12 mm LAD). She was on coumadin until 2005 when she had a small hemorrhagic stroke and it was stopped. She has been on ASA and Plavix over the last three years. She had a nuclear stress test and echo In 2011 in [facility]. She thinks her stress test was ok. She was told she had enlargement of both atria on the echo. She also wore a heart monitor which showed ventricular tachycardia. She was admitted to [facility] and underwent a cardiac cath. Her family was told that she might need a pacemaker (? ICD). This was in May of 2011. Since then, she has been dyspneic. She denies any chest pain. She does endorse lower ext edema. She went to the [facility] ED in march 2012 with dizziness and syncope and was found to have a UTI. I ordered an echo. Her echo showed normal LV function. PAP mildly elevated. No valvular issues. I saw her back three weeks ago and she was still feeling dizzy with standing, weak.  No fever, chills, cough. She does endorse SOB. No chest pain. She had bloodwork with Dr. Lesia Hausen in march 2012 with normal H/H, renal function and TSH. She had syncope and CP before her stent was placed in 2009. No palpitations or awareness of irregularity of her heart rhythm. As above, recent normal stress test at other office. At the last visit I arranged a 48 hour holter monitor and carotid artery dopplers. The Holter monitor showed normal sinus rhythm with rare PVCs and several short runs of SVT. No evidence of VT or heart block.  Carotid artery dopplers with mild disease bilaterally, less than 39%.   She feels much better. Dizziness resolved. No CP or SOB.   Her primary care is Dr. Lesia Hausen.   Past Medical History  Diagnosis Date  . ALLERGIC RHINITIS 04/14/2010  . Atrial fibrillation 04/14/2010  . CEREBROVASCULAR ACCIDENT, HX OF 04/14/2010  . COPD 04/14/2010  . CORONARY ARTERY DISEASE 04/14/2010  . GERD 04/14/2010  . HYPERLIPIDEMIA 04/14/2010  . HYPERTENSION 04/14/2010  . HYPOTHYROIDISM 04/14/2010  . OSTEOARTHRITIS 04/14/2010  . PEPTIC ULCER DISEASE 04/14/2010    Past Surgical History  Procedure Date  . Appendectomy   . Cholecystectomy   . Breast biopsy     Current Outpatient Prescriptions  Medication Sig Dispense Refill  . aspirin 81 MG tablet Take 81 mg by mouth daily. 2 pills daily      . betamethasone dipropionate (DIPROLENE) 0.05 % cream Apply topically 2 (two) times daily as needed.        . Calcium Carbonate (CALCIUM 500 PO) Take 2 tablets by mouth daily.        . clopidogrel (PLAVIX) 75 MG tablet Take 75 mg by mouth daily.        Marland Kitchen diltiazem (CARDIZEM CD) 240 MG 24 hr capsule Take 240 mg by mouth daily.        . diphenhydrAMINE (SOMINEX) 25 MG tablet Take 25 mg by mouth at bedtime as needed.        Marland Kitchen  ergocalciferol (VITAMIN D2) 50000 UNITS capsule Take 50,000 Units by mouth once a week.        . fluticasone (FLOVENT HFA) 110 MCG/ACT inhaler Inhale 2 puffs into the lungs 2 (two) times daily.        . Glucosamine-Chondroit-Vit C-Mn (GLUCOSAMINE 1500 COMPLEX PO) Take 1 tablet by mouth 2 (two) times daily.        Marland Kitchen levalbuterol (XOPENEX HFA) 45 MCG/ACT inhaler Inhale 1-2 puffs into the lungs every 4 (four) hours as needed.        Marland Kitchen levothyroxine (SYNTHROID, LEVOTHROID) 125 MCG tablet Take 125 mcg by mouth daily.        . metoprolol succinate (TOPROL XL) 25 MG 24 hr tablet Take 1 tablet (25 mg total) by mouth daily.  30 tablet  6  . nitroGLYCERIN (NITROSTAT) 0.4 MG SL tablet Place 1 tablet (0.4 mg total)  under the tongue every 5 (five) minutes as needed for chest pain.  25 tablet  3  . Omega-3 Fatty Acids (FISH OIL) 1000 MG CAPS Take 1 capsule by mouth 3 (three) times daily.       Marland Kitchen oxybutynin (DITROPAN) 5 MG tablet Take 1 tablet (5 mg total) by mouth 2 (two) times daily.  90 tablet  6  . pantoprazole (PROTONIX) 20 MG tablet Take 20 mg by mouth daily.        . simvastatin (ZOCOR) 20 MG tablet Take 20 mg by mouth at bedtime.        . valsartan (DIOVAN) 160 MG tablet Take 160 mg by mouth daily.          Allergies  Allergen Reactions  . Codeine     REACTION: hallucinate  . Erythromycin     REACTION: hives  . Morphine   . Sulfonamide Derivatives     REACTION: SOB    History   Social History  . Marital Status: Widowed    Spouse Name: N/A    Number of Children: N/A  . Years of Education: N/A   Occupational History  . Not on file.   Social History Main Topics  . Smoking status: Former Smoker    Quit date: 05/08/1991  . Smokeless tobacco: Never Used  . Alcohol Use: No  . Drug Use: No  . Sexually Active: Not on file   Other Topics Concern  . Not on file   Social History Narrative   Occupation:  retired CNAWidow/Widower    Family History  Problem Relation Age of Onset  . Heart attack    . Heart failure    . Pulmonary fibrosis    . Cancer    . Heart failure Mother   . Heart disease Father     first MI age 69s    Review of Systems:  As stated in the HPI and otherwise negative.   BP 126/98  Pulse 58  Resp 18  Ht 5\' 3"  (1.6 m)  Wt 186 lb 1.9 oz (84.423 kg)  BMI 32.97 kg/m2  Physical Examination: General: Well developed, well nourished, NAD HEENT: OP clear, mucus membranes moist SKIN: warm, dry. No rashes. Neuro: No focal deficits Musculoskeletal: Muscle strength 5/5 all ext Psychiatric: Mood and affect normal Neck: No JVD, no carotid bruits, no thyromegaly, no lymphadenopathy. Lungs:Clear bilaterally, no wheezes, rhonci, crackles Cardiovascular: Regular  rate and rhythm. No murmurs, gallops or rubs. Abdomen:Soft. Bowel sounds present. Non-tender.  Extremities: Mild lower extremity edema. Pulses are 2 + in the bilateral DP/PT.  48 Hour Holter Monitor:  09/18/10: NSR, Rare PVcs. Several short runs SVT  Carotid artery dopplers: 09/15/10: Mild bilateral plaque, 0-39%.

## 2010-10-04 NOTE — Assessment & Plan Note (Signed)
Stable. No changes. Continue ASA, Plavix, Statin, beta blocker.

## 2010-10-04 NOTE — Assessment & Plan Note (Signed)
Will add HCT to Diovan (Diovan/HCT 160/25 once daily).

## 2010-10-13 ENCOUNTER — Encounter: Payer: Self-pay | Admitting: Gastroenterology

## 2010-10-16 ENCOUNTER — Telehealth: Payer: Self-pay | Admitting: *Deleted

## 2010-10-16 NOTE — Telephone Encounter (Signed)
Pt scheduled for screening colonoscopy w/ Dr. Christella Hartigan.  Pt is taking Plavix and has not been seen by Dr. Christella Hartigan.  Called pt to schedule office visit. Pt says that she is not taking her Plavix because she cannot afford it.  I told pt that she would still need an office visit.  Pt said to"just take me off your list" and hung up.  PV and colonoscopy cancelled. Tonya Willis

## 2010-10-18 ENCOUNTER — Ambulatory Visit (INDEPENDENT_AMBULATORY_CARE_PROVIDER_SITE_OTHER): Payer: Medicare Other | Admitting: Internal Medicine

## 2010-10-18 ENCOUNTER — Encounter: Payer: Self-pay | Admitting: Internal Medicine

## 2010-10-18 DIAGNOSIS — J449 Chronic obstructive pulmonary disease, unspecified: Secondary | ICD-10-CM

## 2010-10-18 DIAGNOSIS — I4891 Unspecified atrial fibrillation: Secondary | ICD-10-CM

## 2010-10-18 DIAGNOSIS — E785 Hyperlipidemia, unspecified: Secondary | ICD-10-CM

## 2010-10-18 DIAGNOSIS — I10 Essential (primary) hypertension: Secondary | ICD-10-CM

## 2010-10-18 DIAGNOSIS — I2510 Atherosclerotic heart disease of native coronary artery without angina pectoris: Secondary | ICD-10-CM

## 2010-10-18 NOTE — Patient Instructions (Signed)
Limit your sodium (Salt) intake    It is important that you exercise regularly, at least 20 minutes 3 to 4 times per week.  If you develop chest pain or shortness of breath seek  medical attention.  Return in 6 months for follow-up  

## 2010-10-18 NOTE — Progress Notes (Signed)
  Subjective:    Patient ID: Tonya Willis, female    DOB: 1935/01/30, 76 y.o.   MRN: [id number]  HPI  76 year old patient who is seen today for followup. She is resubmitted and evaluated by cardiology and her medications adjusted. She is now on metoprolol 25 mg daily and hydrochlorothiazide has been added to her regimen of Diovan. She is doing quite well. Her cardiopulmonary status has been stable. She remains on aspirin and Plavix. Her only complaint today is dry mouth. Medical regimen includes Ditropan twice daily. She was told that this is most likely the cause of her dry mouth. She remains on simvastatin 20 mg daily which she continues to tolerate well;  medical regimen also includes diltiazem 240 mg daily    Review of Systems  Constitutional: Negative.   HENT: Negative for hearing loss, congestion, sore throat, rhinorrhea, dental problem, sinus pressure and tinnitus.   Eyes: Negative for pain, discharge and visual disturbance.  Respiratory: Positive for shortness of breath. Negative for cough.   Cardiovascular: Negative for chest pain, palpitations and leg swelling.  Gastrointestinal: Negative for nausea, vomiting, abdominal pain, diarrhea, constipation, blood in stool and abdominal distention.  Genitourinary: Negative for dysuria, urgency, frequency, hematuria, flank pain, vaginal bleeding, vaginal discharge, difficulty urinating, vaginal pain and pelvic pain.  Musculoskeletal: Negative for joint swelling, arthralgias and gait problem.  Skin: Negative for rash.  Neurological: Negative for dizziness, syncope, speech difficulty, weakness, numbness and headaches.  Hematological: Negative for adenopathy.  Psychiatric/Behavioral: Negative for behavioral problems, dysphoric mood and agitation. The patient is not nervous/anxious.        Objective:   Physical Exam  Constitutional: She is oriented to person, place, and time. She appears well-developed and well-nourished.  HENT:  Head:  Normocephalic.  Right Ear: External ear normal.  Left Ear: External ear normal.  Mouth/Throat: Oropharynx is clear and moist.  Eyes: Conjunctivae and EOM are normal. Pupils are equal, round, and reactive to light.  Neck: Normal range of motion. Neck supple. No thyromegaly present.  Cardiovascular: Normal rate, regular rhythm, normal heart sounds and intact distal pulses.   Pulmonary/Chest: Effort normal and breath sounds normal.  Abdominal: Soft. Bowel sounds are normal. She exhibits no mass. There is no tenderness.  Musculoskeletal: Normal range of motion.  Lymphadenopathy:    She has no cervical adenopathy.  Neurological: She is alert and oriented to person, place, and time.  Skin: Skin is warm and dry. No rash noted.  Psychiatric: She has a normal mood and affect. Her behavior is normal.          Assessment & Plan:   Hypertension well controlled Dyslipidemia. Stable Hypothyroidism. We'll check a TSH next visit Coronary artery disease. Clinically stable

## 2010-10-27 ENCOUNTER — Other Ambulatory Visit: Payer: Medicare Other | Admitting: Gastroenterology

## 2010-11-12 ENCOUNTER — Emergency Department (HOSPITAL_COMMUNITY): Payer: Medicare Other

## 2010-11-12 ENCOUNTER — Inpatient Hospital Stay (HOSPITAL_COMMUNITY)
Admission: EM | Admit: 2010-11-12 | Discharge: 2010-11-15 | DRG: 312 | Disposition: A | Discharge status: Home / Self Care | Payer: Medicare Other | Attending: Internal Medicine | Admitting: Internal Medicine

## 2010-11-12 DIAGNOSIS — J449 Chronic obstructive pulmonary disease, unspecified: Secondary | ICD-10-CM | POA: Diagnosis present

## 2010-11-12 DIAGNOSIS — Z8673 Personal history of transient ischemic attack (TIA), and cerebral infarction without residual deficits: Secondary | ICD-10-CM

## 2010-11-12 DIAGNOSIS — E039 Hypothyroidism, unspecified: Secondary | ICD-10-CM | POA: Diagnosis present

## 2010-11-12 DIAGNOSIS — J4489 Other specified chronic obstructive pulmonary disease: Secondary | ICD-10-CM | POA: Diagnosis present

## 2010-11-12 DIAGNOSIS — E785 Hyperlipidemia, unspecified: Secondary | ICD-10-CM | POA: Diagnosis present

## 2010-11-12 DIAGNOSIS — Y92009 Unspecified place in unspecified non-institutional (private) residence as the place of occurrence of the external cause: Secondary | ICD-10-CM

## 2010-11-12 DIAGNOSIS — T50995A Adverse effect of other drugs, medicaments and biological substances, initial encounter: Secondary | ICD-10-CM | POA: Diagnosis present

## 2010-11-12 DIAGNOSIS — I4891 Unspecified atrial fibrillation: Secondary | ICD-10-CM | POA: Diagnosis present

## 2010-11-12 DIAGNOSIS — I9589 Other hypotension: Principal | ICD-10-CM | POA: Diagnosis present

## 2010-11-12 DIAGNOSIS — Z7902 Long term (current) use of antithrombotics/antiplatelets: Secondary | ICD-10-CM

## 2010-11-12 DIAGNOSIS — Z79899 Other long term (current) drug therapy: Secondary | ICD-10-CM

## 2010-11-12 DIAGNOSIS — I10 Essential (primary) hypertension: Secondary | ICD-10-CM | POA: Diagnosis present

## 2010-11-12 DIAGNOSIS — Z9861 Coronary angioplasty status: Secondary | ICD-10-CM

## 2010-11-12 DIAGNOSIS — I2510 Atherosclerotic heart disease of native coronary artery without angina pectoris: Secondary | ICD-10-CM | POA: Diagnosis present

## 2010-11-12 DIAGNOSIS — E7800 Pure hypercholesterolemia, unspecified: Secondary | ICD-10-CM | POA: Diagnosis present

## 2010-11-12 DIAGNOSIS — I498 Other specified cardiac arrhythmias: Secondary | ICD-10-CM | POA: Diagnosis present

## 2010-11-12 DIAGNOSIS — Z7982 Long term (current) use of aspirin: Secondary | ICD-10-CM

## 2010-11-12 DIAGNOSIS — Z882 Allergy status to sulfonamides status: Secondary | ICD-10-CM

## 2010-11-12 DIAGNOSIS — Z888 Allergy status to other drugs, medicaments and biological substances status: Secondary | ICD-10-CM

## 2010-11-12 LAB — COMPREHENSIVE METABOLIC PANEL
AST: 15 U/L (ref 0–37)
Albumin: 3.3 g/dL — ABNORMAL LOW (ref 3.5–5.2)
Alkaline Phosphatase: 79 U/L (ref 39–117)
Chloride: 102 mEq/L (ref 96–112)
Potassium: 3.5 mEq/L (ref 3.5–5.1)
Sodium: 137 mEq/L (ref 135–145)
Total Bilirubin: 0.2 mg/dL — ABNORMAL LOW (ref 0.3–1.2)
Total Protein: 6.8 g/dL (ref 6.0–8.3)

## 2010-11-12 LAB — CBC
HCT: 41.4 % (ref 36.0–46.0)
Hemoglobin: 14.2 g/dL (ref 12.0–15.0)
MCH: 29 pg (ref 26.0–34.0)
MCV: 84.7 fL (ref 78.0–100.0)
Platelets: 256 10*3/uL (ref 150–400)
RBC: 4.89 MIL/uL (ref 3.87–5.11)

## 2010-11-12 LAB — DIFFERENTIAL
Eosinophils Absolute: 0.4 10*3/uL (ref 0.0–0.7)
Lymphocytes Relative: 35 % (ref 12–46)
Lymphs Abs: 4.1 10*3/uL — ABNORMAL HIGH (ref 0.7–4.0)
Monocytes Relative: 11 % (ref 3–12)
Neutrophils Relative %: 50 % (ref 43–77)

## 2010-11-12 LAB — TROPONIN I: Troponin I: <0.3 ng/mL (ref <0.30)

## 2010-11-12 LAB — PRO B NATRIURETIC PEPTIDE: Pro B Natriuretic peptide (BNP): 161.7 pg/mL — ABNORMAL HIGH (ref 0–125)

## 2010-11-13 LAB — URINALYSIS, ROUTINE W REFLEX MICROSCOPIC
Bilirubin Urine: NEGATIVE
Glucose, UA: NEGATIVE mg/dL
Ketones, ur: NEGATIVE mg/dL
Nitrite: NEGATIVE
Protein, ur: NEGATIVE mg/dL
pH: 6 (ref 5.0–8.0)

## 2010-11-13 LAB — TSH: TSH: 0.243 u[IU]/mL — ABNORMAL LOW (ref 0.350–4.500)

## 2010-11-13 LAB — CARDIAC PANEL(CRET KIN+CKTOT+MB+TROPI)
CK, MB: 1.7 ng/mL (ref 0.3–4.0)
Relative Index: INVALID (ref 0.0–2.5)
Relative Index: INVALID (ref 0.0–2.5)
Total CK: 43 U/L (ref 7–177)

## 2010-11-14 LAB — BASIC METABOLIC PANEL
BUN: 11 mg/dL (ref 6–23)
Calcium: 8.4 mg/dL (ref 8.4–10.5)
Creatinine, Ser: 0.52 mg/dL (ref 0.50–1.10)
GFR calc non Af Amer: >60 mL/min (ref >60)
Glucose, Bld: 85 mg/dL (ref 70–99)

## 2010-11-14 LAB — CBC
HCT: 37.5 % (ref 36.0–46.0)
Hemoglobin: 12.6 g/dL (ref 12.0–15.0)
MCH: 28.4 pg (ref 26.0–34.0)
MCHC: 33.6 g/dL (ref 30.0–36.0)
RDW: 13.2 % (ref 11.5–15.5)

## 2010-11-15 LAB — T3, FREE: T3, Free: 2.5 pg/mL (ref 2.3–4.2)

## 2010-11-15 LAB — URINE CULTURE

## 2010-11-15 LAB — T4, FREE: Free T4: 1.19 ng/dL (ref 0.80–1.80)

## 2010-11-17 ENCOUNTER — Encounter: Payer: Self-pay | Admitting: Internal Medicine

## 2010-11-17 ENCOUNTER — Ambulatory Visit: Payer: Medicare Other | Admitting: Internal Medicine

## 2010-11-17 ENCOUNTER — Ambulatory Visit (INDEPENDENT_AMBULATORY_CARE_PROVIDER_SITE_OTHER): Payer: Medicare Other | Admitting: Internal Medicine

## 2010-11-17 DIAGNOSIS — I2510 Atherosclerotic heart disease of native coronary artery without angina pectoris: Secondary | ICD-10-CM

## 2010-11-17 DIAGNOSIS — I4891 Unspecified atrial fibrillation: Secondary | ICD-10-CM

## 2010-11-17 DIAGNOSIS — E039 Hypothyroidism, unspecified: Secondary | ICD-10-CM

## 2010-11-17 DIAGNOSIS — I10 Essential (primary) hypertension: Secondary | ICD-10-CM

## 2010-11-17 NOTE — Progress Notes (Signed)
  Subjective:    Patient ID: Tonya Willis, female    DOB: August 21, 1934, 76 y.o.   MRN: [id number]  HPI  76 year old patient who is seen today for followup. She was discharged from the hospital 2 days ago following hypotension and bradycardia. Post hospital discharge she has been taking diltiazem 240 mg per day only and metoprolol has been discontinued. She feels quite well. Denies any cardiopulmonary complaints no weakness or dizziness. She has treated hypothyroidism and remains on supplemental levothyroxine. She remains on aspirin and Plavix hospital records reviewed   Review of Systems  Constitutional: Negative.   HENT: Negative for hearing loss, congestion, sore throat, rhinorrhea, dental problem, sinus pressure and tinnitus.   Eyes: Negative for pain, discharge and visual disturbance.  Respiratory: Negative for cough and shortness of breath.   Cardiovascular: Negative for chest pain, palpitations and leg swelling.  Gastrointestinal: Negative for nausea, vomiting, abdominal pain, diarrhea, constipation, blood in stool and abdominal distention.  Genitourinary: Negative for dysuria, urgency, frequency, hematuria, flank pain, vaginal bleeding, vaginal discharge, difficulty urinating, vaginal pain and pelvic pain.  Musculoskeletal: Negative for joint swelling, arthralgias and gait problem.  Skin: Negative for rash.  Neurological: Negative for dizziness, syncope, speech difficulty, weakness, numbness and headaches.  Hematological: Negative for adenopathy.  Psychiatric/Behavioral: Negative for behavioral problems, dysphoric mood and agitation. The patient is not nervous/anxious.        Objective:   Physical Exam  Constitutional: She is oriented to person, place, and time. She appears well-developed and well-nourished.  HENT:  Head: Normocephalic.  Right Ear: External ear normal.  Left Ear: External ear normal.  Mouth/Throat: Oropharynx is clear and moist.  Eyes: Conjunctivae and EOM are  normal. Pupils are equal, round, and reactive to light.  Neck: Normal range of motion. Neck supple. No thyromegaly present.  Cardiovascular: Normal rate, regular rhythm, normal heart sounds and intact distal pulses.        Pulse rate 60 and regular  Pulmonary/Chest: Effort normal and breath sounds normal.       O2 saturation 95  Abdominal: Soft. Bowel sounds are normal. She exhibits no mass. There is no tenderness.  Musculoskeletal: Normal range of motion.  Lymphadenopathy:    She has no cervical adenopathy.  Neurological: She is alert and oriented to person, place, and time.  Skin: Skin is warm and dry. No rash noted.  Psychiatric: She has a normal mood and affect. Her behavior is normal.          Assessment & Plan:   Paroxysmal atrial fibrillation. Presently normal sinus rhythm Hypertension well controlled Coronary artery disease stable  We'll continue present regimen Cardiology followup Recheck here 6 months

## 2010-11-17 NOTE — Patient Instructions (Signed)
Limit your sodium (Salt) intake    It is important that you exercise regularly, at least 20 minutes 3 to 4 times per week.  If you develop chest pain or shortness of breath seek  medical attention.  Return in 6 months for follow-up  

## 2010-11-19 NOTE — Discharge Summary (Signed)
  NAMEEMBERLEY, KRAL NO.:  [id number]  MEDICAL RECORD NO.:  [id number]  LOCATION:  2009                         FACILITY:  [facility]  PHYSICIAN:  Lonia Blood, M.D.       DATE OF BIRTH:  1935-01-09  DATE OF ADMISSION:  11/12/2010 DATE OF DISCHARGE:  11/15/2010                              DISCHARGE SUMMARY   PRIMARY CARE PHYSICIAN:  Gordy Savers, MD.  PRIMARY CARDIOLOGIST:  Verne Carrow, MD.  DISCHARGE DIAGNOSES: 1. Hypotension, bradycardia, resolved after discontinuation of calcium-     channel blocker and beta-blocker - final recommendation from Dr.     Verne Carrow is to use Cardizem 240 mg daily and     discontinue the Toprol. 2. Hypotension and presyncope due to polypharmacy, resolved. 3. Paroxysmal atrial fibrillation, currently the patient is discharged     in normal sinus rhythm. 4. History of transient ischemic attack. 5. Hypothyroidism. 6. Hypertension. 7. Hyperlipidemia.  DISCHARGE MEDICATIONS: 1. Diltiazem extended release 240 mg daily. 2. Aspirin 81 mg daily. 3. Calcium 2 tablets daily. 4. Glucosamine 2 tablets daily. 5. Levothyroxine 125 mcg daily. 6. Nitroglycerin 0.4 mg every 5 minutes as needed for chest pain. 7. Plavix 75 mg daily. 8. Xopenex 45 mcg inhaled 2 puffs twice a day as needed. 9. Zocor 20 mg daily.  CONDITION ON DISCHARGE:  Ms. Savin was discharged in good condition. She will follow up with Dr. Verne Carrow to see how the diltiazem is working.  PROCEDURES DURING THIS ADMISSION:  No procedures done.  CONSULTATION:  No consultations obtained.  HISTORY AND PHYSICAL:  Refer dictated H and P done by Dr. Houston Siren.  HOSPITAL COURSE:  Ms. Roselli is a 76 year old woman with known episode of paroxysmal atrial fibrillation, rapid ventricular response, on diltiazem and metoprolol, was admitted from the emergency room with presyncope, hypotension, bradycardia.  She was taken off of the diltiazem  and metoprolol and placed on telemetry.  On telemetry, she started running at the 70s, 80s, and up to 100s.  We initially started on metoprolol 25 mg daily, but after discussing it with Dr. Clifton James, he suggested that we start the diltiazem 240 mg daily as Ms. Mausolf has had a recent Holter monitoring suggesting that she has had intermittent paroxysmal atrial fibrillation with rapid ventricular response quite frequently.  Otherwise, Ms. Roads will continue aspirin and Plavix and was noted that she was not having any signs of stroke.  She has been monitored in the hospital where she ambulated up and on the hallways without any difficulties.  We have also check a TSH level which came back at 0.243.  We went back and check the free T4 and T3 and both are within normal limits, so we did not adjust further the Synthroid dose. Otherwise, all her other medication be continued without changes.     Lonia Blood, M.D.     SL/MEDQ  D:  11/15/2010  T:  11/16/2010  Job:  [id number]  cc:   Gordy Savers, MD Verne Carrow, MD  Electronically Signed by Lonia Blood M.D. on 11/19/2010 07:57:48 AM

## 2010-11-20 ENCOUNTER — Ambulatory Visit: Payer: Medicare Other | Admitting: Internal Medicine

## 2010-12-02 NOTE — H&P (Signed)
NAME:  Tonya Willis, Tonya Willis NO.:  [id number]  MEDICAL RECORD NO.:  [id number]  LOCATION:  [facility]                         FACILITY:  [facility]  PHYSICIAN:  Houston Siren, MD           DATE OF BIRTH:  1935-03-18  DATE OF ADMISSION:  11/12/2010 DATE OF DISCHARGE:                             HISTORY & PHYSICAL   PRIMARY CARE PHYSICIAN:  Dr. Dutch Gray.  ADVANCE DIRECTIVE:  Full code.  REASON FOR ADMISSION:  Symptomatic hypotension and bradycardia.  HISTORY OF PRESENT ILLNESS:  This is a 76 year old female with history of atrial fibrillation, prior RVR, on Lopressor and Cardizem along with history of prior CVA, TIA, coronary artery disease, status post two cardiac stent placements, history of hypothyroidism, presents to the emergency room feeling lightheadedness.  She was found to have a systolic blood pressure of 70 and heart rate in the 50s.  She was given intravenous fluids of approximately 1 liter, but still gets orthostatic symptomatology upon standing up.  Her past medical history is as above, and she is also on Diovan and hydrochlorothiazide along with Cardizem.  In the past, she had RVR and thus, both Cardizem and Lopressor were given to her.  She is also on aspirin and Plavix for her atrial fibrillation.  Evaluation shows EKG with sinus bradycardia at about 50.  Chest x-ray is clear.  Creatinine of 1.18 and cardiac markers were negative.  Her BNP is only slightly elevated at 161, and her white count is 11.7,000 with hemoglobin of 14.2. Hospitalist was asked to admit the patient because of symptomatic hypotension and bradycardia felt to be secondary to medications.  PAST MEDICAL HISTORY: 1. Coronary artery disease, status post cardiac stent. 2. TIA. 3. CVA. 4. Atrial fibrillation with rapid ventricular rate. 5. Hypothyroidism. 6. Hypertension. 7. Hypercholesterolemia. 8. COPD.  ALLERGIES: 1. CODEINE. 2. MORPHINE. 3. NOVAHISTINE. 4. SULFONAMIDE.  FAMILY  HISTORY:  Noncontributory.  SOCIAL HISTORY:  She lives with her son.  She denied tobacco, alcohol, or drug use.  REVIEW OF SYSTEMS:  Otherwise unremarkable.  CURRENT MEDICATIONS: 1. Diovan. 2. Flovent. 3. Hydrochlorothiazide. 4. Levoxyl 125 mcg per day. 5. Lopressor 25 mg per day. 6. Nitroglycerin p.r.n. 7. Plavix. 8. Prilosec. 9. Simvastatin. 10.Singulair. 11.Xopenex. 12.Aspirin. 13.Diphenhydramine 25 mg nightly. 14.Cardizem 240 mg per day.  PHYSICAL EXAMINATION:  VITAL SIGNS:  Prior systolic blood pressure was 70, heart rate of 50, it is now 116/77, heart rate of 53, respiratory rate of 16, temperature 97.9. GENERAL:  Shows that she is alert and oriented and conversing.  She has no peripheral sign of shock. SKIN:  Warm and dry.  Pupils are equal, round, and reactive to light and accommodation.  She has facial symmetry.  Speech is fluent.  Tongue is midline.  Uvula elevated with phonation.  She has no carotid bruits. NECK:  Supple. CARDIAC:  Reveal S1-S2 regular.  I did not hear any murmur, rub, or gallop. LUNGS:  Clear bilaterally without any wheezes, rales, or any evidence of consolidation. ABDOMEN:  Soft, nondistended, and nontender. EXTREMITIES:  With no edema.  Good distal pulses bilaterally.  She has no calf tenderness. NEUROLOGIC:  Unremarkable as well. PSYCHIATRIC:  Unremarkable  as well.  OBJECTIVE FINDINGS:  As above.  IMPRESSION:  This is a 76 year old female with hypertension, history of atrial fibrillation, on negative chronotropes and presents with symptomatic hypotension and bradycardia.  I strongly suspect that she is overmedicated and is experiencing side effects of her medications.  We will admit her for observation.  I would like to discontinue Diovan, hydrochlorothiazide and Toprol.  I will continue her Cardizem, but we will lower the dose to 120 mg from 240 mg per day, as she had tachyarrythmia without them in the past. She is currently stable.   We will continue her IV fluid with potassium supplement.  Because she is on Synthroid, we will check TSH as well.  We will admit her to [facility].  She is a full code.     Houston Siren, MD     PL/MEDQ  D:  11/12/2010  T:  11/12/2010  Job:  [id number]  cc:   Dr. Dutch Gray  Electronically Signed by Houston Siren  on 12/02/2010 02:05:36 AM

## 2011-02-23 ENCOUNTER — Encounter: Payer: Self-pay | Admitting: Cardiovascular Disease

## 2011-04-18 ENCOUNTER — Other Ambulatory Visit: Payer: Self-pay | Admitting: Internal Medicine

## 2011-04-19 ENCOUNTER — Encounter: Payer: Self-pay | Admitting: Internal Medicine

## 2011-04-19 ENCOUNTER — Ambulatory Visit (INDEPENDENT_AMBULATORY_CARE_PROVIDER_SITE_OTHER): Payer: Medicare Other | Admitting: Internal Medicine

## 2011-04-19 DIAGNOSIS — I10 Essential (primary) hypertension: Secondary | ICD-10-CM

## 2011-04-19 DIAGNOSIS — I4891 Unspecified atrial fibrillation: Secondary | ICD-10-CM

## 2011-04-19 DIAGNOSIS — I2510 Atherosclerotic heart disease of native coronary artery without angina pectoris: Secondary | ICD-10-CM

## 2011-04-19 DIAGNOSIS — E785 Hyperlipidemia, unspecified: Secondary | ICD-10-CM

## 2011-04-19 DIAGNOSIS — M1990 Unspecified osteoarthritis, unspecified site: Secondary | ICD-10-CM

## 2011-04-19 DIAGNOSIS — E039 Hypothyroidism, unspecified: Secondary | ICD-10-CM

## 2011-04-19 MED ORDER — DILTIAZEM HCL ER COATED BEADS 120 MG PO CP24: 120.0000 mg | ORAL_CAPSULE | Freq: Every day | ORAL | Status: DC

## 2011-04-19 MED ORDER — LEVOTHYROXINE SODIUM 125 MCG PO TABS: 125.0000 ug | ORAL_TABLET | Freq: Every day | ORAL | Status: DC

## 2011-04-19 MED ORDER — SIMVASTATIN 20 MG PO TABS: 20.0000 mg | ORAL_TABLET | Freq: Every day | ORAL | Status: DC

## 2011-04-19 MED ORDER — FLUTICASONE PROPIONATE HFA 110 MCG/ACT IN AERO: 2.0000 | INHALATION_SPRAY | Freq: Two times a day (BID) | RESPIRATORY_TRACT | Status: DC

## 2011-04-19 MED ORDER — LEVALBUTEROL TARTRATE 45 MCG/ACT IN AERO: 1.0000 | INHALATION_SPRAY | RESPIRATORY_TRACT | Status: DC | PRN

## 2011-04-19 NOTE — Progress Notes (Signed)
  Subjective:    Patient ID: Tonya Willis, female    DOB: 1934-11-09, 76 y.o.   MRN: [id number]  HPI  77 -year-old patient who is seen today for followup. She has a history of coronary artery disease and paroxysmal atrial fibrillation. He also has a history of cerebral vascular disease.  Plavix was discontinued after one year of therapy. Remains on aspirin but is not very compliant with her medications. She has been off many of her medications including simvastatin. She has done quite well today does have history of COPD. Denies any exertional chest pain. She has treated hypothyroidism Wt Readings from Last 3 Encounters:  04/19/11 176 lb (79.833 kg)  11/17/10 187 lb (84.823 kg)  10/18/10 186 lb (84.369 kg)    Review of Systems  Constitutional: Negative.   HENT: Negative for hearing loss, congestion, sore throat, rhinorrhea, dental problem, sinus pressure and tinnitus.   Eyes: Negative for pain, discharge and visual disturbance.  Respiratory: Negative for cough and shortness of breath.   Cardiovascular: Negative for chest pain, palpitations and leg swelling.  Gastrointestinal: Negative for nausea, vomiting, abdominal pain, diarrhea, constipation, blood in stool and abdominal distention.  Genitourinary: Negative for dysuria, urgency, frequency, hematuria, flank pain, vaginal bleeding, vaginal discharge, difficulty urinating, vaginal pain and pelvic pain.  Musculoskeletal: Negative for joint swelling, arthralgias and gait problem.  Skin: Negative for rash.  Neurological: Negative for dizziness, syncope, speech difficulty, weakness, numbness and headaches.  Hematological: Negative for adenopathy.  Psychiatric/Behavioral: Negative for behavioral problems, dysphoric mood and agitation. The patient is not nervous/anxious.        Objective:   Physical Exam  Constitutional: She is oriented to person, place, and time. She appears well-developed and well-nourished.  HENT:  Head: Normocephalic.    Right Ear: External ear normal.  Left Ear: External ear normal.  Mouth/Throat: Oropharynx is clear and moist.  Eyes: Conjunctivae and EOM are normal. Pupils are equal, round, and reactive to light.  Neck: Normal range of motion. Neck supple. No thyromegaly present.  Cardiovascular: Normal rate, regular rhythm, normal heart sounds and intact distal pulses.   Pulmonary/Chest: Effort normal and breath sounds normal.  Abdominal: Soft. Bowel sounds are normal. She exhibits no mass. There is no tenderness.  Musculoskeletal: Normal range of motion.  Lymphadenopathy:    She has no cervical adenopathy.  Neurological: She is alert and oriented to person, place, and time.  Skin: Skin is warm and dry. No rash noted.  Psychiatric: She has a normal mood and affect. Her behavior is normal.          Assessment & Plan:    Hypertension. Well controlled we'll continue present regimen Paroxysmal atrial fibrillation. Remains in normal sinus rhythm Dyslipidemia will resume simvastatin. Compliance stressed Coronary artery disease asymptomatic

## 2011-04-19 NOTE — Patient Instructions (Signed)
Limit your sodium (Salt) intake    It is important that you exercise regularly, at least 20 minutes 3 to 4 times per week.  If you develop chest pain or shortness of breath seek  medical attention.  Return in 6 months for follow-up  

## 2011-06-22 ENCOUNTER — Other Ambulatory Visit: Payer: Self-pay | Admitting: Internal Medicine

## 2011-07-13 ENCOUNTER — Ambulatory Visit (INDEPENDENT_AMBULATORY_CARE_PROVIDER_SITE_OTHER): Payer: Medicare Other | Admitting: Internal Medicine

## 2011-07-13 ENCOUNTER — Encounter: Payer: Self-pay | Admitting: Internal Medicine

## 2011-07-13 DIAGNOSIS — I4891 Unspecified atrial fibrillation: Secondary | ICD-10-CM

## 2011-07-13 DIAGNOSIS — I10 Essential (primary) hypertension: Secondary | ICD-10-CM

## 2011-07-13 DIAGNOSIS — R55 Syncope and collapse: Secondary | ICD-10-CM

## 2011-07-13 DIAGNOSIS — E785 Hyperlipidemia, unspecified: Secondary | ICD-10-CM

## 2011-07-13 DIAGNOSIS — I2510 Atherosclerotic heart disease of native coronary artery without angina pectoris: Secondary | ICD-10-CM

## 2011-07-13 LAB — CBC WITH DIFFERENTIAL/PLATELET
Eosinophils Relative: 3.1 % (ref 0.0–5.0)
HCT: 42.8 % (ref 36.0–46.0)
Lymphs Abs: 2.5 10*3/uL (ref 0.7–4.0)
MCV: 88.8 fl (ref 78.0–100.0)
Monocytes Absolute: 0.7 10*3/uL (ref 0.1–1.0)
Platelets: 253 10*3/uL (ref 150.0–400.0)
WBC: 8.1 10*3/uL (ref 4.5–10.5)

## 2011-07-13 LAB — COMPREHENSIVE METABOLIC PANEL
ALT: 17 U/L (ref 0–35)
AST: 21 U/L (ref 0–37)
Alkaline Phosphatase: 64 U/L (ref 39–117)
Glucose, Bld: 82 mg/dL (ref 70–99)
Sodium: 139 mEq/L (ref 135–145)
Total Bilirubin: 0.2 mg/dL — ABNORMAL LOW (ref 0.3–1.2)
Total Protein: 7 g/dL (ref 6.0–8.3)

## 2011-07-13 LAB — TSH: TSH: 0.21 u[IU]/mL — ABNORMAL LOW (ref 0.35–5.50)

## 2011-07-13 NOTE — Patient Instructions (Signed)
Decrease diltiazem to 120 mg daily Limit your sodium (Salt) intake  Cardiology followup next week as scheduled  Return in 3 months for follow-up

## 2011-07-13 NOTE — Progress Notes (Signed)
  Subjective:    Patient ID: Tonya Willis, female    DOB: Sep 10, 1934, 77 y.o.   MRN: [id number]  HPI   77 year old patient who presents with a 6 week history of weakness. 3 days ago the patient had a near single episode. She was hospitalized briefly in July of last year for weakness and noted to have bradycardia and hypotension. At that time she was taking Cardizem 240 mg daily as well as beta blocker therapy. Beta blocker therapy was discontinued at that time. Since her discharge Cardizem has been down titrated to 120 mg daily. The patient takes an extra dose when she notes fast irregular palpitations. Over the past month she has taken 3 additional 120 mg capsules. Today she feels well.    Review of Systems  Cardiovascular: Positive for palpitations.  Neurological: Positive for weakness and light-headedness.       Objective:   Physical Exam  Constitutional: She is oriented to person, place, and time. She appears well-developed and well-nourished.       Blood pressure on arrival 92/70  General blood pressures during the course of the exam range from 120/80-140/84  Pulse regular  HENT:  Head: Normocephalic.  Right Ear: External ear normal.  Left Ear: External ear normal.  Mouth/Throat: Oropharynx is clear and moist.  Eyes: Conjunctivae and EOM are normal. Pupils are equal, round, and reactive to light.  Neck: Normal range of motion. Neck supple. No thyromegaly present.  Cardiovascular: Normal rate, regular rhythm, normal heart sounds and intact distal pulses.   Pulmonary/Chest: Effort normal and breath sounds normal.       O2 saturation 98  Abdominal: Soft. Bowel sounds are normal. She exhibits no mass. There is no tenderness.  Musculoskeletal: Normal range of motion.  Lymphadenopathy:    She has no cervical adenopathy.  Neurological: She is alert and oriented to person, place, and time.  Skin: Skin is warm and dry. No rash noted.  Psychiatric: She has a normal mood and affect.  Her behavior is normal.          Assessment & Plan:   Episodic weakness. Concerned that this is a cardiac etiology either episodic bradycardia and/or associated hypotension.   Paroxysmal atrial fibrillation. The patient continues to have palpitations;  we'll refer back to cardiology for their recommendations concerning chronic anticoagulation and also their recommendations for rhythm and rate control medications;  will consider Holter or event monitor for further evaluation

## 2011-07-25 ENCOUNTER — Telehealth: Payer: Self-pay | Admitting: Internal Medicine

## 2011-07-25 MED ORDER — OMEPRAZOLE 20 MG PO CPDR: 20.0000 mg | DELAYED_RELEASE_CAPSULE | Freq: Every day | ORAL | Status: DC

## 2011-07-25 NOTE — Telephone Encounter (Signed)
Ok  20 mg generic

## 2011-07-25 NOTE — Telephone Encounter (Signed)
I dont see on current or past med list - please advise

## 2011-07-25 NOTE — Telephone Encounter (Signed)
Pt requesting refill on generic prilosec  [facility]

## 2011-07-25 NOTE — Telephone Encounter (Signed)
Addended by: Kern Reap B on: 07/25/2011 12:53 PM   Modules accepted: Orders

## 2011-07-26 ENCOUNTER — Encounter: Payer: Self-pay | Admitting: Internal Medicine

## 2011-07-26 ENCOUNTER — Ambulatory Visit (INDEPENDENT_AMBULATORY_CARE_PROVIDER_SITE_OTHER): Payer: Medicare Other | Admitting: Internal Medicine

## 2011-07-26 VITALS — BP 110/70 | Temp 98.0°F | Wt 177.0 lb

## 2011-07-26 DIAGNOSIS — I2510 Atherosclerotic heart disease of native coronary artery without angina pectoris: Secondary | ICD-10-CM

## 2011-07-26 DIAGNOSIS — R21 Rash and other nonspecific skin eruption: Secondary | ICD-10-CM

## 2011-07-26 DIAGNOSIS — I4891 Unspecified atrial fibrillation: Secondary | ICD-10-CM

## 2011-07-26 DIAGNOSIS — I10 Essential (primary) hypertension: Secondary | ICD-10-CM

## 2011-07-27 ENCOUNTER — Encounter: Payer: Self-pay | Admitting: Internal Medicine

## 2011-07-27 NOTE — Progress Notes (Signed)
  Subjective:    Patient ID: Tonya Willis, female    DOB: 19-Mar-1935, 77 y.o.   MRN: [id number]  HPI  77 year old patient who is seen today for followup. She has a history of hypertension paroxysmal atrial fibrillation dyslipidemia. She has a history of coronary artery disease and COPD which has been stable. Her chief concern was a rash involving her right upper back area. She was somewhat concerned about shingles  Review of Systems  Skin: Positive for rash.       Objective:   Physical Exam  Constitutional: She is oriented to person, place, and time. She appears well-developed and well-nourished.  HENT:  Head: Normocephalic.  Right Ear: External ear normal.  Left Ear: External ear normal.  Mouth/Throat: Oropharynx is clear and moist.  Eyes: Conjunctivae and EOM are normal. Pupils are equal, round, and reactive to light.  Neck: Normal range of motion. Neck supple. No thyromegaly present.  Cardiovascular: Normal rate, regular rhythm, normal heart sounds and intact distal pulses.   Pulmonary/Chest: Effort normal and breath sounds normal.  Abdominal: Soft. Bowel sounds are normal. She exhibits no mass. There is no tenderness.  Musculoskeletal: Normal range of motion.  Lymphadenopathy:    She has no cervical adenopathy.  Neurological: She is alert and oriented to person, place, and time.  Skin: Skin is warm and dry. Rash noted.       2 dry flaky skin involving the back area a few excoriations are noted involving the right upper back area  Psychiatric: She has a normal mood and affect. Her behavior is normal.          Assessment & Plan:   Hypertension stable Coronary artery disease Nonspecific dermatitis/xerosis- local skin care discussed. Recheck 6 months

## 2011-07-27 NOTE — Patient Instructions (Signed)
Limit your sodium (Salt) intake  Return in 6 months for follow-up  

## 2011-08-14 ENCOUNTER — Encounter: Payer: Self-pay | Admitting: Cardiovascular Disease

## 2011-08-14 ENCOUNTER — Ambulatory Visit (INDEPENDENT_AMBULATORY_CARE_PROVIDER_SITE_OTHER): Payer: Medicare Other | Admitting: Cardiovascular Disease

## 2011-08-14 VITALS — BP 119/76 | HR 94 | Ht 63.0 in | Wt 173.0 lb

## 2011-08-14 DIAGNOSIS — I2510 Atherosclerotic heart disease of native coronary artery without angina pectoris: Secondary | ICD-10-CM

## 2011-08-14 MED ORDER — DILTIAZEM HCL ER 240 MG PO CP24: 240.0000 mg | ORAL_CAPSULE | Freq: Every day | ORAL | Status: DC

## 2011-08-14 NOTE — Progress Notes (Signed)
History of Present Illness: 77 yo WF with history of CAD with previous stenting 2009, atrial fibrillation, CVA, ventricular tachycardia, OA, COPD, depression and is here today for cardiac follow up. I saw her as a new patient in April 2012. She had been followed in [facility] by Dr.Cheung. Her initial cardiac procedures were done in [facility] by [facility]. She had a stent placed in 2009 (Endeavor 3.0 x 12 mm LAD). She was on coumadin until 2005 when she had a small hemorrhagic stroke and it was stopped. She has been on ASA and Plavix over the last three years. She had a nuclear stress test and echo In 2011 in [facility]. She thinks her stress test was ok. She was told she had enlargement of both atria on the echo. She also wore a heart monitor which showed ventricular tachycardia. She was admitted to [facility] and underwent a cardiac cath. Her family was told that she might need a pacemaker (? ICD). This was in May of 2011. She went to the [facility] ED in march 2012 with dizziness and syncope and was found to have a UTI. Echo March 2012 showed normal LV function. PAP mildly elevated. No valvular issues. I saw her back in April 2012 and she was still feeling dizzy with standing, weak with some SOB. No chest pain.  I arranged a 48 hour holter which showed normal sinus rhythm with rare PVCs and several short runs of SVT. No evidence of VT or heart block. Carotid artery dopplers with mild disease bilaterally, less than 39% in April 2012.   She is here today for follow up. She has had some chest pains with exertion. Only last for a few seconds. No SOB, near syncope or syncope. She does endorse palpitations occurring several times per week and has been taking Cardizem CD 120 mg once daily with an extra 120 mg if she is feeling palpitations. (her dose was lowered to 120 mg daily last year while in the hospital because of transient bradycardia).  Primary Care Physician:  Dr. Lesia Hausen.  Last Lipid Profile: No recent lipids in our system  Past Medical History  Diagnosis Date  . ALLERGIC RHINITIS 04/14/2010  . Atrial fibrillation 04/14/2010  . CEREBROVASCULAR ACCIDENT, HX OF 04/14/2010  . COPD 04/14/2010  . CORONARY ARTERY DISEASE 04/14/2010  . GERD 04/14/2010  . HYPERLIPIDEMIA 04/14/2010  . HYPERTENSION 04/14/2010  . HYPOTHYROIDISM 04/14/2010  . OSTEOARTHRITIS 04/14/2010  . PEPTIC ULCER DISEASE 04/14/2010    Past Surgical History  Procedure Date  . Appendectomy   . Cholecystectomy   . Breast biopsy     Current Outpatient Prescriptions  Medication Sig Dispense Refill  . acetaminophen (TYLENOL) 500 MG tablet Take 500 mg by mouth every 6 (six) hours as needed.      Marland Kitchen aspirin 81 MG tablet Pt doesn't take asa every day because it irritates her stomach      . Calcium Carbonate (CALCIUM 500 PO) Take 2 tablets by mouth daily.        Marland Kitchen diltiazem (CARDIZEM CD) 120 MG 24 hr capsule Take 1 capsule (120 mg total) by mouth daily.  90 capsule  3  . diphenhydrAMINE (BENADRYL) 25 MG tablet Take 25 mg by mouth every 6 (six) hours as needed.      . fluticasone (FLOVENT HFA) 110 MCG/ACT inhaler Inhale 2 puffs into the lungs 2 (two) times daily.  1 Inhaler  6  . Glucosamine-Chondroit-Vit C-Mn (GLUCOSAMINE 1500 COMPLEX  PO) Take 1 tablet by mouth 2 (two) times daily.        Marland Kitchen guaiFENesin (MUCINEX) 600 MG 12 hr tablet Take 1,200 mg by mouth 2 (two) times daily.      Marland Kitchen levalbuterol (XOPENEX HFA) 45 MCG/ACT inhaler Inhale 1-2 puffs into the lungs every 4 (four) hours as needed.  1 Inhaler  6  . levothyroxine (SYNTHROID, LEVOTHROID) 125 MCG tablet Take 1 tablet (125 mcg total) by mouth daily.  90 tablet  2  . loperamide (IMODIUM) 2 MG capsule Take 2 mg by mouth 4 (four) times daily as needed.      . nitroGLYCERIN (NITROSTAT) 0.4 MG SL tablet Place 1 tablet (0.4 mg total) under the tongue every 5 (five) minutes as needed for chest pain.  25 tablet  3  . OIL OF [place]REGANO PO Take by  mouth. 1 tab prn      . Omega-3 Fatty Acids (FISH OIL) 1000 MG CAPS Take 1 capsule by mouth 3 (three) times daily.       Marland Kitchen omeprazole (PRILOSEC) 20 MG capsule Take 1 capsule (20 mg total) by mouth daily.  90 capsule  3  . oxybutynin (DITROPAN) 5 MG tablet TAKE 1 TABLET BY MOUTH 2 TIMES A DAY  90 tablet  2  . Simethicone (GAS-X PO) Take by mouth. Take 1 tab as needed      . simvastatin (ZOCOR) 20 MG tablet Take 1 tablet (20 mg total) by mouth at bedtime.  90 tablet  6    Allergies  Allergen Reactions  . Codeine     REACTION: hallucinate  . Erythromycin     REACTION: hives  . Morphine   . Sulfonamide Derivatives     REACTION: SOB    History   Social History  . Marital Status: Widowed    Spouse Name: N/A    Number of Children: N/A  . Years of Education: N/A   Occupational History  . Not on file.   Social History Main Topics  . Smoking status: Former Smoker    Quit date: 05/08/1991  . Smokeless tobacco: Never Used  . Alcohol Use: No  . Drug Use: No  . Sexually Active: Not on file   Other Topics Concern  . Not on file   Social History Narrative   Occupation:  retired CNAWidow/Widower    Family History  Problem Relation Age of Onset  . Heart attack    . Heart failure    . Pulmonary fibrosis    . Cancer    . Heart failure Mother   . Heart disease Father     first MI age 78s    Review of Systems:  As stated in the HPI and otherwise negative.   BP 119/76  Pulse 94  Ht 5\' 3"  (1.6 m)  Wt 173 lb (78.472 kg)  BMI 30.65 kg/m2  Physical Examination: General: Well developed, well nourished, NAD HEENT: OP clear, mucus membranes moist SKIN: warm, dry. No rashes. Neuro: No focal deficits Musculoskeletal: Muscle strength 5/5 all ext Psychiatric: Mood and affect normal Neck: No JVD, no carotid bruits, no thyromegaly, no lymphadenopathy. Lungs:Clear bilaterally, no wheezes, rhonci, crackles Cardiovascular: Regular rate and rhythm. No murmurs, gallops or  rubs. Abdomen:Soft. Bowel sounds present. Non-tender.  Extremities: No lower extremity edema. Pulses are 2 + in the bilateral DP/PT.

## 2011-08-14 NOTE — Assessment & Plan Note (Signed)
She is having some mild chest pains that resolve quickly. She does not wish to arrange a stress test at this time. Will continue medical therapy. BP is well controlled. She needs lipids this spring with LFTs. Will discuss possible stress testing at f/u visit.

## 2011-08-14 NOTE — Assessment & Plan Note (Signed)
She has a history of paroxysmal atrial fibrillation. 48 hour monitor last year did not show atrial fibrillation. She is having more episodes of weakness with palpitations. Will increase Cardizem CD to 240 mg po QDaily and will arrange 21 day monitor. I have discussed potentially starting chronic anti-coagulation. She will think about this. She had been on coumadin prior to 2005 but this was stopped at that time. She says she had a TIA at that time and it was stopped but I cannot confirm any presence of any intracranial bleeding at that time.

## 2011-08-14 NOTE — Patient Instructions (Signed)
Your physician recommends that you schedule a follow-up appointment in: 4 weeks.   Your physician has recommended that you wear an event monitor. Event monitors are medical devices that record the heart's electrical activity. Doctors most often [place] these monitors to diagnose arrhythmias. Arrhythmias are problems with the speed or rhythm of the heartbeat. The monitor is a small, portable device. You can wear one while you do your normal daily activities. This is usually used to diagnose what is causing palpitations/syncope (passing out).   Your physician recommends that you return for fasting lab work later this week or next week-- Lipid and Liver profile  Your physician has recommended you make the following change in your medication: Change diltiazem to 240 mg by mouth daily.

## 2011-08-16 ENCOUNTER — Telehealth: Payer: Self-pay | Admitting: Cardiovascular Disease

## 2011-08-16 NOTE — Telephone Encounter (Signed)
Reviewed with Marcos Eke and code is [phone] for monitor. I tried to reach pt at home number but no answer x 2.  Left message on cell phone to call back

## 2011-08-16 NOTE — Telephone Encounter (Signed)
Spoke with pt and gave her the monitor code.

## 2011-08-16 NOTE — Telephone Encounter (Signed)
Please return call to patient at hm# [phone]   Patient needs [facility] code for heart moniter to verify this service will be covered with insurance.

## 2011-08-21 ENCOUNTER — Other Ambulatory Visit (INDEPENDENT_AMBULATORY_CARE_PROVIDER_SITE_OTHER): Payer: Medicare Other

## 2011-08-21 ENCOUNTER — Encounter (INDEPENDENT_AMBULATORY_CARE_PROVIDER_SITE_OTHER): Payer: Medicare Other

## 2011-08-21 ENCOUNTER — Telehealth: Payer: Self-pay | Admitting: Cardiovascular Disease

## 2011-08-21 DIAGNOSIS — R002 Palpitations: Secondary | ICD-10-CM

## 2011-08-21 DIAGNOSIS — I2510 Atherosclerotic heart disease of native coronary artery without angina pectoris: Secondary | ICD-10-CM

## 2011-08-21 LAB — LIPID PANEL
Cholesterol: 151 mg/dL (ref 0–200)
LDL Cholesterol: 83 mg/dL (ref 0–99)
Total CHOL/HDL Ratio: 3

## 2011-08-21 LAB — HEPATIC FUNCTION PANEL
Alkaline Phosphatase: 67 U/L (ref 39–117)
Bilirubin, Direct: 0 mg/dL (ref 0.0–0.3)
Total Protein: 6.9 g/dL (ref 6.0–8.3)

## 2011-08-22 NOTE — Telephone Encounter (Signed)
Error

## 2011-08-27 ENCOUNTER — Telehealth: Payer: Self-pay | Admitting: *Deleted

## 2011-08-27 NOTE — Telephone Encounter (Signed)
Pt called feeling ok, same as been- sluggish,fatigued. Feels Ok currently, please advise : c/o episode Saturday early morning chest pain woke her up, states heart was beating so fast 134 BPM, bp 158/111. C/o neck/ jaw pain. Used nitro x 1 with resolve of CP. cp lasted 15-20 minutes. No further cp occurrence, Pt has monitor on but didn't push button to record.  Please advise.

## 2011-08-27 NOTE — Telephone Encounter (Signed)
I will be glad to see her tomorrow in the office if she wishes. It looks like I have a 9am slot, 11am slot. Can we check and see if she would like to come in for the visit? Thanks, chris

## 2011-08-28 ENCOUNTER — Ambulatory Visit: Payer: Medicare Other | Admitting: Cardiovascular Disease

## 2011-08-28 NOTE — Telephone Encounter (Signed)
Spoke with pt. She is feeling well today but cancelled appt because she did not have a ride to office. She will keep scheduled appt on May 14. I offered her earlier appt but she wants to keep appt on May 14.

## 2011-08-28 NOTE — Telephone Encounter (Signed)
RN in office spoke with pt and appt made for today. Appt information then indicates pt called and cancelled appt. I called to speak with pt and left message to call back

## 2011-09-18 ENCOUNTER — Encounter: Payer: Self-pay | Admitting: Cardiovascular Disease

## 2011-09-18 ENCOUNTER — Ambulatory Visit (INDEPENDENT_AMBULATORY_CARE_PROVIDER_SITE_OTHER): Payer: Medicare Other | Admitting: Cardiovascular Disease

## 2011-09-18 VITALS — BP 130/66 | HR 84 | Ht 63.0 in | Wt 176.0 lb

## 2011-09-18 DIAGNOSIS — I2510 Atherosclerotic heart disease of native coronary artery without angina pectoris: Secondary | ICD-10-CM

## 2011-09-18 DIAGNOSIS — I4891 Unspecified atrial fibrillation: Secondary | ICD-10-CM

## 2011-09-18 MED ORDER — DILTIAZEM HCL ER 240 MG PO CP24: 240.0000 mg | ORAL_CAPSULE | Freq: Every day | ORAL | Status: DC

## 2011-09-18 NOTE — Patient Instructions (Signed)
Your physician wants you to follow-up in:  6 months. You will receive a reminder letter in the mail two months in advance. If you don't receive a letter, please call our office to schedule the follow-up appointment.   

## 2011-09-18 NOTE — Progress Notes (Signed)
History of Present Illness:76 yo WF with history of CAD with previous stenting 2009, atrial fibrillation, CVA, ventricular tachycardia, OA, COPD, depression and is here today for cardiac follow up. I saw her as a new patient in April 2012. She had been followed in [facility] by Dr.Cheung. Her initial cardiac procedures were done in [facility] by [facility]. She had a stent placed in 2009 (Endeavor 3.0 x 12 mm LAD). She was on coumadin until 2005 when she had a small hemorrhagic stroke and it was stopped. She has been on ASA and Plavix over the last three years. She had a nuclear stress test and echo In 2011 in [facility]. She thinks her stress test was ok. She was told she had enlargement of both atria on the echo. She also wore a heart monitor which showed ventricular tachycardia. She was admitted to [facility] and underwent a cardiac cath. Her family was told that she might need a pacemaker (? ICD). This was in May of 2011. She went to the [facility] ED in march 2012 with dizziness and syncope and was found to have a UTI. Echo March 2012 showed normal LV function. PAP mildly elevated. No valvular issues. I saw her back in April 2012 and she was still feeling dizzy with standing, weak with some SOB. No chest pain. I arranged a 48 hour holter which showed normal sinus rhythm with rare PVCs and several short runs of SVT. No evidence of VT or heart block. Carotid artery dopplers with mild disease bilaterally, less than 39% in April 2012. I saw her again in April 2013 and she had c/o daily palpitations. She had been taking Cardizem CD 120 mg once daily with an extra 120 mg if she is feeling palpitations. (her dose was lowered to 120 mg daily last year while in the hospital because of transient bradycardia). I arranged a 21 day event monitor which showed sinus rhythm with PACs and short run of SVT.   She is here for follow up. She is doing well. Occasionally feels  palpitations. No chest pain, SOB or dizziness.    Primary Care Physician: Dr  Last Lipid Profile:  Past Medical History  Diagnosis Date  . ALLERGIC RHINITIS 04/14/2010  . Atrial fibrillation 04/14/2010  . CEREBROVASCULAR ACCIDENT, HX OF 04/14/2010  . COPD 04/14/2010  . CORONARY ARTERY DISEASE 04/14/2010  . GERD 04/14/2010  . HYPERLIPIDEMIA 04/14/2010  . HYPERTENSION 04/14/2010  . HYPOTHYROIDISM 04/14/2010  . OSTEOARTHRITIS 04/14/2010  . PEPTIC ULCER DISEASE 04/14/2010    Past Surgical History  Procedure Date  . Appendectomy   . Cholecystectomy   . Breast biopsy     Current Outpatient Prescriptions  Medication Sig Dispense Refill  . acetaminophen (TYLENOL) 500 MG tablet Take 500 mg by mouth every 6 (six) hours as needed.      Marland Kitchen aspirin 81 MG tablet Pt doesn't take asa every day because it irritates her stomach      . Calcium Carbonate (CALCIUM 500 PO) Take 2 tablets by mouth daily.        Marland Kitchen diltiazem (DILACOR XR) 240 MG 24 hr capsule Take 1 capsule (240 mg total) by mouth daily.  30 capsule  6  . diphenhydrAMINE (BENADRYL) 25 MG tablet Take 25 mg by mouth every 6 (six) hours as needed.      . diphenhydramine-acetaminophen (TYLENOL PM) 25-500 MG TABS Take 1 tablet by mouth at bedtime as needed.      Marland Kitchen  fluticasone (FLOVENT HFA) 110 MCG/ACT inhaler Inhale 2 puffs into the lungs 2 (two) times daily.  1 Inhaler  6  . Glucosamine-Chondroit-Vit C-Mn (GLUCOSAMINE 1500 COMPLEX PO) Take 1 tablet by mouth 2 (two) times daily.        Marland Kitchen guaiFENesin (MUCINEX) 600 MG 12 hr tablet Take 1,200 mg by mouth 2 (two) times daily.      Marland Kitchen levalbuterol (XOPENEX HFA) 45 MCG/ACT inhaler Inhale 1-2 puffs into the lungs every 4 (four) hours as needed.  1 Inhaler  6  . levothyroxine (SYNTHROID, LEVOTHROID) 125 MCG tablet Take 1 tablet (125 mcg total) by mouth daily.  90 tablet  2  . loperamide (IMODIUM) 2 MG capsule Take 2 mg by mouth 4 (four) times daily as needed.      . OIL OF [place]ANO PO Take by mouth. 1 tab prn       . Omega-3 Fatty Acids (FISH OIL) 1000 MG CAPS Take 1 capsule by mouth 3 (three) times daily.       Marland Kitchen omeprazole (PRILOSEC) 20 MG capsule Take 1 capsule (20 mg total) by mouth daily.  90 capsule  3  . oxybutynin (DITROPAN) 5 MG tablet TAKE 1 TABLET BY MOUTH 2 TIMES A DAY  90 tablet  2  . Simethicone (GAS-X PO) Take by mouth. Take 1 tab as needed      . simvastatin (ZOCOR) 20 MG tablet Take 1 tablet (20 mg total) by mouth at bedtime.  90 tablet  6  . St Johns Wort 300 MG CAPS Take by mouth. daily      . nitroGLYCERIN (NITROSTAT) 0.4 MG SL tablet Place 1 tablet (0.4 mg total) under the tongue every 5 (five) minutes as needed for chest pain.  25 tablet  3    Allergies  Allergen Reactions  . Codeine     REACTION: hallucinate  . Erythromycin     REACTION: hives  . Morphine   . Sulfonamide Derivatives     REACTION: SOB    History   Social History  . Marital Status: Widowed    Spouse Name: N/A    Number of Children: N/A  . Years of Education: N/A   Occupational History  . Not on file.   Social History Main Topics  . Smoking status: Former Smoker    Quit date: 05/08/1991  . Smokeless tobacco: Never Used  . Alcohol Use: No  . Drug Use: No  . Sexually Active: Not on file   Other Topics Concern  . Not on file   Social History Narrative   Occupation:  retired CNAWidow/Widower    Family History  Problem Relation Age of Onset  . Heart attack    . Heart failure    . Pulmonary fibrosis    . Cancer    . Heart failure Mother   . Heart disease Father     first MI age 46s    Review of Systems:  As stated in the HPI and otherwise negative.   BP 130/66  Pulse 84  Ht 5\' 3"  (1.6 m)  Wt 176 lb (79.833 kg)  BMI 31.18 kg/m2  Physical Examination: General: Well developed, well nourished, NAD HEENT: OP clear, mucus membranes moist SKIN: warm, dry. No rashes. Neuro: No focal deficits Musculoskeletal: Muscle strength 5/5 all ext Psychiatric: Mood and affect normal Neck:  No JVD, no carotid bruits, no thyromegaly, no lymphadenopathy. Lungs:Clear bilaterally, no wheezes, rhonci, crackles Cardiovascular: Regular rate and rhythm. No murmurs, gallops or rubs. Abdomen:Soft.  Bowel sounds present. Non-tender.  Extremities: No lower extremity edema. Pulses are 2 + in the bilateral DP/PT.  Event monitor:  Sinus rhythm with PACs, one short run of SVT.

## 2011-09-18 NOTE — Assessment & Plan Note (Signed)
Will continue Cardizem CD 240 mg po QDaily for rate control. We have had a long discussion regarding risk of CVA with PAF and she understands this. Risks of bleeding with anti-coagulation with coumadin outweighs risks of CVA given her history of hemorrhagic CVA. We will not restart coumadin. She is agreeable to this plan.

## 2011-09-18 NOTE — Assessment & Plan Note (Signed)
Stable.  NO changes. 

## 2011-09-25 ENCOUNTER — Other Ambulatory Visit: Payer: Self-pay | Admitting: Internal Medicine

## 2011-10-14 ENCOUNTER — Emergency Department (HOSPITAL_COMMUNITY)
Admission: EM | Admit: 2011-10-14 | Discharge: 2011-10-14 | Disposition: A | Discharge status: Home / Self Care | Payer: Medicare Other | Attending: Emergency Medicine | Admitting: Emergency Medicine

## 2011-10-14 ENCOUNTER — Encounter (HOSPITAL_COMMUNITY): Payer: Self-pay | Admitting: Emergency Medicine

## 2011-10-14 DIAGNOSIS — J4489 Other specified chronic obstructive pulmonary disease: Secondary | ICD-10-CM | POA: Insufficient documentation

## 2011-10-14 DIAGNOSIS — Z8673 Personal history of transient ischemic attack (TIA), and cerebral infarction without residual deficits: Secondary | ICD-10-CM | POA: Insufficient documentation

## 2011-10-14 DIAGNOSIS — J449 Chronic obstructive pulmonary disease, unspecified: Secondary | ICD-10-CM | POA: Insufficient documentation

## 2011-10-14 DIAGNOSIS — R001 Bradycardia, unspecified: Secondary | ICD-10-CM

## 2011-10-14 DIAGNOSIS — I10 Essential (primary) hypertension: Secondary | ICD-10-CM | POA: Insufficient documentation

## 2011-10-14 DIAGNOSIS — R55 Syncope and collapse: Secondary | ICD-10-CM | POA: Insufficient documentation

## 2011-10-14 DIAGNOSIS — I498 Other specified cardiac arrhythmias: Secondary | ICD-10-CM | POA: Insufficient documentation

## 2011-10-14 DIAGNOSIS — K219 Gastro-esophageal reflux disease without esophagitis: Secondary | ICD-10-CM | POA: Insufficient documentation

## 2011-10-14 DIAGNOSIS — I2510 Atherosclerotic heart disease of native coronary artery without angina pectoris: Secondary | ICD-10-CM | POA: Insufficient documentation

## 2011-10-14 DIAGNOSIS — E785 Hyperlipidemia, unspecified: Secondary | ICD-10-CM | POA: Insufficient documentation

## 2011-10-14 DIAGNOSIS — Z79899 Other long term (current) drug therapy: Secondary | ICD-10-CM | POA: Insufficient documentation

## 2011-10-14 NOTE — ED Provider Notes (Cosign Needed)
History     CSN: [id number]  Arrival date & time 10/14/11  1503   First MD Initiated Contact with Patient 10/14/11 1751      Chief Complaint  Patient presents with  . Bradycardia    (Consider location/radiation/quality/duration/timing/severity/associated sxs/prior treatment) HPI Comments: The patient is a 77 year old woman who was reading the paper this morning and felt as though she might faint. She checked her pulse and it was 50 and 48. She had 3 different episodes like this was mild shortness of breath, but she used her inhalers for asthma that has subsided. She feels all right. These episodes happened around 11:30 this morning. She was no chest pain. She's had prior evaluation for bradycardia, and had medications stopped in July 2012. She is followed by Eleonore Chiquito, M.D., her internist, and by Verne Carrow, her cardiologist.  Patient is a 77 y.o. female presenting with syncope. The history is provided by the patient and medical records. No language interpreter was used.  Loss of Consciousness This is a new problem. The current episode started 6 to 12 hours ago. Episode frequency: She had 3 brief episodes around 11:30 A.M. today. The problem has been resolved. Associated symptoms include shortness of breath. Pertinent negatives include no chest pain, no abdominal pain and no headaches. Associated symptoms comments: She used her inhalers and her shortness of breath resolved.. The symptoms are aggravated by nothing. The symptoms are relieved by nothing. Treatments tried: She used albuterol inhaler for shortness of breath.    Past Medical History  Diagnosis Date  . ALLERGIC RHINITIS 04/14/2010  . Atrial fibrillation 04/14/2010  . CEREBROVASCULAR ACCIDENT, HX OF 04/14/2010  . COPD 04/14/2010  . CORONARY ARTERY DISEASE 04/14/2010  . GERD 04/14/2010  . HYPERLIPIDEMIA 04/14/2010  . HYPERTENSION 04/14/2010  . HYPOTHYROIDISM 04/14/2010  . OSTEOARTHRITIS 04/14/2010  . PEPTIC ULCER  DISEASE 04/14/2010    Past Surgical History  Procedure Date  . Appendectomy   . Cholecystectomy   . Breast biopsy     Family History  Problem Relation Age of Onset  . Heart attack    . Heart failure    . Pulmonary fibrosis    . Cancer    . Heart failure Mother   . Heart disease Father     first MI age 44s    History  Substance Use Topics  . Smoking status: Former Smoker    Quit date: 05/08/1991  . Smokeless tobacco: Never Used  . Alcohol Use: No    OB History    Grav Para Term Preterm Abortions TAB SAB Ect Mult Living                  Review of Systems  Constitutional: Negative.  Negative for fever and chills.  HENT: Negative.   Eyes: Negative.   Respiratory: Positive for shortness of breath.   Cardiovascular: Positive for syncope. Negative for chest pain.       Heart rate was about 48-50 when she was symptomatic, as measured by her.  Gastrointestinal: Negative.  Negative for abdominal pain.  Genitourinary: Negative.   Musculoskeletal: Negative.   Neurological: Negative.  Negative for headaches. Syncope: 3 brief episodes where she thought she might faint while reading the Sunday paper.  Psychiatric/Behavioral: Negative.     Allergies  Ambien; Codeine; Erythromycin; Morphine; and Sulfonamide derivatives  Home Medications   Current Outpatient Rx  Name Route Sig Dispense Refill  . ACETAMINOPHEN 500 MG PO TABS Oral Take 500 mg by mouth every 6 (six)  hours as needed.    . ASPIRIN 81 MG PO TABS  Pt doesn't take asa every day because it irritates her stomach    . CALCIUM 500 PO Oral Take 2 tablets by mouth daily.      Marland Kitchen DILTIAZEM HCL ER 240 MG PO CP24 Oral Take 240 mg by mouth daily.    Marland Kitchen DIPHENHYDRAMINE HCL 25 MG PO TABS Oral Take 25 mg by mouth every 6 (six) hours as needed.    Marland Kitchen FLUTICASONE PROPIONATE  HFA 110 MCG/ACT IN AERO Inhalation Inhale 2 puffs into the lungs 2 (two) times daily. 1 Inhaler 6  . GLUCOSAMINE 1500 COMPLEX PO Oral Take 1 tablet by mouth 2  (two) times daily.      . GUAIFENESIN ER 600 MG PO TB12 Oral Take 1,200 mg by mouth 2 (two) times daily.    Marland Kitchen LEVALBUTEROL TARTRATE 45 MCG/ACT IN AERO Inhalation Inhale 1-2 puffs into the lungs every 4 (four) hours as needed. 1 Inhaler 6  . LEVOTHYROXINE SODIUM 125 MCG PO TABS Oral Take 125 mcg by mouth daily.    Marland Kitchen LOPERAMIDE HCL 2 MG PO CAPS Oral Take 2 mg by mouth 4 (four) times daily as needed.    Marland Kitchen FISH OIL 1000 MG PO CAPS Oral Take 1 capsule by mouth 3 (three) times daily.     Marland Kitchen OMEPRAZOLE 20 MG PO CPDR Oral Take 20 mg by mouth daily.    . OXYBUTYNIN CHLORIDE 5 MG PO TABS  TAKE 1 TABLET BY MOUTH 2 TIMES A DAY 180 tablet 1  . GAS-X PO Oral Take by mouth. Take 1 tab as needed    . SIMVASTATIN 20 MG PO TABS Oral Take 20 mg by mouth at bedtime.    . ST JOHNS WORT 300 MG PO CAPS Oral Take by mouth. daily    . NITROGLYCERIN 0.4 MG SL SUBL Sublingual Place 1 tablet (0.4 mg total) under the tongue every 5 (five) minutes as needed for chest pain. 25 tablet 3    BP 152/57  Pulse 56  Temp(Src) 97.8 F (36.6 C) (Oral)  Resp 18  SpO2 97%  Physical Exam  Nursing note and vitals reviewed. Constitutional: She is oriented to person, place, and time. She appears well-developed and well-nourished.       Patient is a pleasant elderly lady in no distress. She is awake and alert. She is somewhat deaf.  HENT:  Head: Normocephalic and atraumatic.  Right Ear: External ear normal.  Left Ear: External ear normal.  Mouth/Throat: Oropharynx is clear and moist.  Eyes: Conjunctivae and EOM are normal. Pupils are equal, round, and reactive to light.  Neck: Normal range of motion. Neck supple.  Cardiovascular: Normal rate, regular rhythm and normal heart sounds.   Pulmonary/Chest: Effort normal and breath sounds normal.  Abdominal: Soft. Bowel sounds are normal.  Musculoskeletal: Normal range of motion. She exhibits no edema and no tenderness.  Neurological: She is alert and oriented to person, place, and  time.       No sensory or motor deficit.  Skin: Skin is warm and dry.  Psychiatric: She has a normal mood and affect. Her behavior is normal.    ED Course  Procedures (including critical care time)  5:57 PM  Date: 10/14/2011  Rate: 59  Rhythm: sinus bradycardia  QRS Axis: normal  Intervals: PR prolonged  ST/T Wave abnormalities: normal  Conduction Disutrbances:none  Narrative Interpretation: Normal EKG  Old EKG Reviewed: unchanged  6:20 PM Pt had  three brief episodes of near-syncope about 6 hours ago, which have resolved.  Her EKG is nonacute.  She is asympomatic.  I think she has had the test of time and nothing has happened, so it is safe to release her.  She has an appointment to see Dr. Amador Cunas next week.     1. Near syncope   2. Bradycardia          Carleene Cooper III, MD 10/16/11 1246

## 2011-10-14 NOTE — ED Notes (Signed)
Pt ambulated without difficulty. No dizziness or pain occurred during ambulation which was done independently with stand by assist.

## 2011-10-14 NOTE — Discharge Instructions (Signed)
Mrs. Tonya Willis, you had physical examination and EKG to check on you after you had three brief episodes where you felt you might faint.  Fortunately, those symptoms have resolved.  Your physical examination and EKG are good.  Dr. Ignacia Palma feels it is safe for you to go home, and to have followup with Dr. Amador Cunas, your internal medicine specialist.

## 2011-10-14 NOTE — ED Notes (Signed)
Reports she felt like she was going to pass out while reading newspaper this morning.  States she checked her BP and HR was 50.  States "it was really hard to get my breath."  States she used inhalers and sob improved. C/o headache x 30 minutes.

## 2011-10-15 ENCOUNTER — Telehealth: Payer: Self-pay | Admitting: Internal Medicine

## 2011-10-15 NOTE — Telephone Encounter (Signed)
Pt called and is scheduled to come in for emp on 10/19/11, but pt went to ER yesterday because HR dropped down to 48 and BP was normal. Pt wants to keep her ov on Friday and didn't want to come in sooner. Pt just wanted to make Dr Amador Cunas aware.

## 2011-10-15 NOTE — Telephone Encounter (Signed)
noted 

## 2011-10-19 ENCOUNTER — Encounter: Payer: Self-pay | Admitting: Internal Medicine

## 2011-10-19 ENCOUNTER — Ambulatory Visit (INDEPENDENT_AMBULATORY_CARE_PROVIDER_SITE_OTHER): Payer: Medicare Other | Admitting: Internal Medicine

## 2011-10-19 VITALS — BP 120/82 | HR 74 | Temp 98.9°F | Resp 18 | Ht 63.0 in | Wt 177.0 lb

## 2011-10-19 DIAGNOSIS — Z8679 Personal history of other diseases of the circulatory system: Secondary | ICD-10-CM

## 2011-10-19 DIAGNOSIS — I4891 Unspecified atrial fibrillation: Secondary | ICD-10-CM

## 2011-10-19 DIAGNOSIS — Z0000 Encounter for general adult medical examination without abnormal findings: Secondary | ICD-10-CM

## 2011-10-19 DIAGNOSIS — I2510 Atherosclerotic heart disease of native coronary artery without angina pectoris: Secondary | ICD-10-CM

## 2011-10-19 DIAGNOSIS — E785 Hyperlipidemia, unspecified: Secondary | ICD-10-CM

## 2011-10-19 DIAGNOSIS — M1990 Unspecified osteoarthritis, unspecified site: Secondary | ICD-10-CM

## 2011-10-19 DIAGNOSIS — I10 Essential (primary) hypertension: Secondary | ICD-10-CM

## 2011-10-19 MED ORDER — OXYBUTYNIN CHLORIDE 5 MG PO TABS: 5.0000 mg | ORAL_TABLET | Freq: Two times a day (BID) | ORAL | Status: DC

## 2011-10-19 MED ORDER — LEVALBUTEROL TARTRATE 45 MCG/ACT IN AERO: 1.0000 | INHALATION_SPRAY | RESPIRATORY_TRACT | Status: DC | PRN

## 2011-10-19 MED ORDER — FLUTICASONE PROPIONATE HFA 110 MCG/ACT IN AERO: 2.0000 | INHALATION_SPRAY | Freq: Two times a day (BID) | RESPIRATORY_TRACT | Status: AC

## 2011-10-19 MED ORDER — LEVOTHYROXINE SODIUM 125 MCG PO TABS: 125.0000 ug | ORAL_TABLET | Freq: Every day | ORAL | Status: DC

## 2011-10-19 MED ORDER — NITROGLYCERIN 0.4 MG SL SUBL: 0.4000 mg | SUBLINGUAL_TABLET | SUBLINGUAL | Status: AC | PRN

## 2011-10-19 MED ORDER — SIMVASTATIN 20 MG PO TABS: 20.0000 mg | ORAL_TABLET | Freq: Every day | ORAL | Status: AC

## 2011-10-19 MED ORDER — DILTIAZEM HCL ER 240 MG PO CP24: 240.0000 mg | ORAL_CAPSULE | Freq: Every day | ORAL | Status: DC

## 2011-10-19 NOTE — Progress Notes (Signed)
Subjective:    Patient ID: Tonya Willis, female    DOB: 10-22-1934, 77 y.o.   MRN: [id number]  HPI  77 year old patient who is seen today for a preventive health examination. She is followed closely by cardiology due to coronary artery disease paroxysmal atrial fibrillation. She has a history of cerebral vascular disease. She has had a fairly recent event monitor. She was evaluated in the ED 5 days ago do to bradycardia and weakness. Today she feels fairly well.  Preventive Screening-Counseling & Management  Alcohol-Tobacco  Smoking Status: quit   Allergies (verified):  1) ! * Novohistine  2) ! Sulfa  3) ! Codeine  4) ! Morphine  5) ! Erythromycin   Past History:  Past Medical History:   Atrial fibrillation  COPD  Coronary artery disease- status post stenting in February 2009  GERD  Hyperlipidemia  Hypertension  Osteoarthritis  Peptic ulcer disease  Cerebrovascular accident, hx of small hemorrhagic stroke 2005  Allergic rhinitis  Hypothyroidism  overactive bladder   Past Surgical History:   Appendectomy 1967  Cholecystectomy 1992  breast biopsy 1992  Hysterectomy 1979  flexible sigmoid,greater than 10 years ago  colonoscopy never   Family History:  Reviewed history and no changes required.   father died age 62, MI  mother died age 71, congestive heart failure  two brothers, both deceased from apparently, pulmonary fibrosis  3 sisters, one deceased from bladder cancer   Social History:  Reviewed history and no changes required.  Occupation: retired Medical illustrator  DC tobaccoSmoking Status: quit  1. Risk factors, based on past  M,S,F history-  patient has known coronary artery disease. Cardiovascular risk factors include hypertension and dyslipidemia. She also has a history of cerebral vascular disease  2.  Physical activities: Remains quite active without exercise limitation  3.  Depression/mood: Some occasional anxiety and depression related to  situational factors. No history of sustained major depression  4.  Hearing: No major deficits  5.  ADL's: Independent in all aspects of daily living  6.  Fall risk: Moderate  7.  Home safety: No problems identified  8.  Height weight, and visual acuity; height and weight stable no change in visual acuity  9.  Counseling: Restricted salt heart healthy diet encouraged 10. Lab orders based on risk factors: Recent laboratory studies reviewed this has included lipid profile and TSH  11. Referral : Followup cardiology  12. Care plan: Continue present regimen. Regular exercise program recommended  13. Cognitive assessment: Alert and oriented normal affect. No cognitive dysfunction   Past Medical History  Diagnosis Date  . ALLERGIC RHINITIS 04/14/2010  . Atrial fibrillation 04/14/2010  . CEREBROVASCULAR ACCIDENT, HX OF 04/14/2010  . COPD 04/14/2010  . CORONARY ARTERY DISEASE 04/14/2010  . GERD 04/14/2010  . HYPERLIPIDEMIA 04/14/2010  . HYPERTENSION 04/14/2010  . HYPOTHYROIDISM 04/14/2010  . OSTEOARTHRITIS 04/14/2010  . PEPTIC ULCER DISEASE 04/14/2010    History   Social History  . Marital Status: Widowed    Spouse Name: N/A    Number of Children: N/A  . Years of Education: N/A   Occupational History  . Not on file.   Social History Main Topics  . Smoking status: Former Smoker    Quit date: 05/08/1991  . Smokeless tobacco: Never Used  . Alcohol Use: No  . Drug Use: No  . Sexually Active: Not on file   Other Topics Concern  . Not on file   Social History Narrative   Occupation:  retired CNAWidow/Widower  Past Surgical History  Procedure Date  . Appendectomy   . Cholecystectomy   . Breast biopsy     Family History  Problem Relation Age of Onset  . Heart attack    . Heart failure    . Pulmonary fibrosis    . Cancer    . Heart failure Mother   . Heart disease Father     first MI age 21s    Allergies  Allergen Reactions  . Ambien (Zolpidem Tartrate) Other  (See Comments)    Severe sleep walking  . Codeine     REACTION: hallucinate  . Erythromycin     REACTION: hives  . Morphine Other (See Comments)    Pt doesn't remember  . Sulfonamide Derivatives     REACTION: SOB    Current Outpatient Prescriptions on File Prior to Visit  Medication Sig Dispense Refill  . acetaminophen (TYLENOL) 500 MG tablet Take 500 mg by mouth every 6 (six) hours as needed.      Marland Kitchen aspirin 81 MG tablet Pt doesn't take asa every day because it irritates her stomach      . Calcium Carbonate (CALCIUM 500 PO) Take 2 tablets by mouth daily.        Marland Kitchen diltiazem (DILACOR XR) 240 MG 24 hr capsule Take 240 mg by mouth daily.      . diphenhydrAMINE (BENADRYL) 25 MG tablet Take 25 mg by mouth every 6 (six) hours as needed.      . fluticasone (FLOVENT HFA) 110 MCG/ACT inhaler Inhale 2 puffs into the lungs 2 (two) times daily.  1 Inhaler  6  . Glucosamine-Chondroit-Vit C-Mn (GLUCOSAMINE 1500 COMPLEX PO) Take 1 tablet by mouth 2 (two) times daily.        Marland Kitchen guaiFENesin (MUCINEX) 600 MG 12 hr tablet Take 1,200 mg by mouth 2 (two) times daily.      Marland Kitchen levalbuterol (XOPENEX HFA) 45 MCG/ACT inhaler Inhale 1-2 puffs into the lungs every 4 (four) hours as needed.  1 Inhaler  6  . levothyroxine (SYNTHROID, LEVOTHROID) 125 MCG tablet Take 125 mcg by mouth daily.      Marland Kitchen loperamide (IMODIUM) 2 MG capsule Take 2 mg by mouth 4 (four) times daily as needed.      . Omega-3 Fatty Acids (FISH OIL) 1000 MG CAPS Take 1 capsule by mouth 3 (three) times daily.       Marland Kitchen omeprazole (PRILOSEC) 20 MG capsule Take 20 mg by mouth daily.      Marland Kitchen oxybutynin (DITROPAN) 5 MG tablet TAKE 1 TABLET BY MOUTH 2 TIMES A DAY  180 tablet  1  . Simethicone (GAS-X PO) Take by mouth. Take 1 tab as needed      . simvastatin (ZOCOR) 20 MG tablet Take 20 mg by mouth at bedtime.      Satira Sark Johns Wort 300 MG CAPS Take by mouth. daily      . nitroGLYCERIN (NITROSTAT) 0.4 MG SL tablet Place 1 tablet (0.4 mg total) under the tongue  every 5 (five) minutes as needed for chest pain.  25 tablet  3    BP 120/82  Pulse 74  Temp 98.9 F (37.2 C) (Oral)  Resp 18  Ht 5\' 3"  (1.6 m)  Wt 177 lb (80.287 kg)  BMI 31.35 kg/m2  SpO2 97%      Review of Systems  HENT: Negative for hearing loss, congestion, sore throat, rhinorrhea, dental problem, sinus pressure and tinnitus.   Eyes: Negative for pain, discharge  and visual disturbance.  Respiratory: Negative for cough and shortness of breath.   Cardiovascular: Negative for chest pain, palpitations and leg swelling.  Gastrointestinal: Negative for nausea, vomiting, abdominal pain, diarrhea, constipation, blood in stool and abdominal distention.  Genitourinary: Negative for dysuria, urgency, frequency, hematuria, flank pain, vaginal bleeding, vaginal discharge, difficulty urinating, vaginal pain and pelvic pain.  Musculoskeletal: Positive for arthralgias. Negative for joint swelling and gait problem.  Skin: Negative for rash.  Neurological: Positive for weakness. Negative for dizziness, syncope, speech difficulty, numbness and headaches.  Hematological: Negative for adenopathy.  Psychiatric/Behavioral: Negative for behavioral problems, dysphoric mood and agitation. The patient is not nervous/anxious.        Objective:   Physical Exam  Constitutional: She is oriented to person, place, and time. She appears well-developed and well-nourished.       Weight 177 Blood pressure 130/80 Pulse regular approximately 70  HENT:  Head: Normocephalic and atraumatic.  Right Ear: External ear normal.  Left Ear: External ear normal.  Mouth/Throat: Oropharynx is clear and moist.       Edentulous  Eyes: Conjunctivae and EOM are normal.  Neck: Normal range of motion. Neck supple. No JVD present. No thyromegaly present.  Cardiovascular: Normal rate, regular rhythm, normal heart sounds and intact distal pulses.   No murmur heard.      Dorsalis pedis pulses full. Posterior tibial pulses  faint  Pulmonary/Chest: Effort normal and breath sounds normal. She has no wheezes. She has no rales.  Abdominal: Soft. Bowel sounds are normal. She exhibits no distension and no mass. There is no tenderness. There is no rebound and no guarding.  Musculoskeletal: Normal range of motion. She exhibits no edema and no tenderness.  Neurological: She is alert and oriented to person, place, and time. She has normal reflexes. No cranial nerve deficit. She exhibits normal muscle tone. Coordination normal.  Skin: Skin is warm and dry. No rash noted.  Psychiatric: She has a normal mood and affect. Her behavior is normal.          Assessment & Plan:   Preventive health examination Coronary artery disease Paroxysmal atrial fibrillation History of cerebral vascular disease Hypothyroidism Dyslipidemia  All medications refilled Low-salt heart healthy diet encouraged Regular exercise regimen encouraged

## 2011-10-19 NOTE — Progress Notes (Signed)
  Subjective:    Patient ID: Tonya Willis, female    DOB: 02-Feb-1935, 77 y.o.   MRN: [id number]  HPI    Review of Systems     Objective:   Physical Exam        Assessment & Plan:

## 2011-10-19 NOTE — Patient Instructions (Signed)
Limit your sodium (Salt) intake    It is important that you exercise regularly, at least 20 minutes 3 to 4 times per week.  If you develop chest pain or shortness of breath seek  medical attention.  Cardiology followup as scheduledDASH Diet The DASH diet stands for "Dietary Approaches to Stop Hypertension." It is a healthy eating plan that has been shown to reduce high blood pressure (hypertension) in as little as 14 days, while also possibly providing other significant health benefits. These other health benefits include reducing the risk of breast cancer after menopause and reducing the risk of type 2 diabetes, heart disease, colon cancer, and stroke. Health benefits also include weight loss and slowing kidney failure in patients with chronic kidney disease.   DIET GUIDELINES  Limit salt (sodium). Your diet should contain less than 1500 mg of sodium daily.   Limit refined or processed carbohydrates. Your diet should include mostly whole grains. Desserts and added sugars should be used sparingly.   Include small amounts of heart-healthy fats. These types of fats include nuts, oils, and tub margarine. Limit saturated and trans fats. These fats have been shown to be harmful in the body.  CHOOSING FOODS   The following food groups are based on a 2000 calorie diet. See your Registered Dietitian for individual calorie needs. Grains and Grain Products (6 to 8 servings daily)  Eat More Often: Whole-wheat bread, brown rice, whole-grain or wheat pasta, quinoa, popcorn without added fat or salt (air popped).   Eat Less Often: White bread, white pasta, white rice, cornbread.  Vegetables (4 to 5 servings daily)  Eat More Often: Fresh, frozen, and canned vegetables. Vegetables may be raw, steamed, roasted, or grilled with a minimal amount of fat.   Eat Less Often/Avoid: Creamed or fried vegetables. Vegetables in a cheese sauce.  Fruit (4 to 5 servings daily)  Eat More Often: All fresh, canned (in  natural juice), or frozen fruits. Dried fruits without added sugar. One hundred percent fruit juice ( cup [237 mL] daily).   Eat Less Often: Dried fruits with added sugar. Canned fruit in light or heavy syrup.  [employer], Fish, and Poultry (2 servings or less daily. One serving is 3 to 4 oz [85-114 g]).  Eat More Often: Ninety percent or leaner ground beef, tenderloin, sirloin. Round cuts of beef, chicken breast, [place] breast. All fish. Grill, bake, or broil your meat. Nothing should be fried.   Eat Less Often/Avoid: Fatty cuts of meat, [place], or chicken leg, thigh, or wing. Fried cuts of meat or fish.  Dairy (2 to 3 servings)  Eat More Often: Low-fat or fat-free milk, low-fat plain or light yogurt, reduced-fat or part-skim cheese.   Eat Less Often/Avoid: Milk (whole, 2%, skim, or chocolate). Whole milk yogurt. Full-fat cheeses.  Nuts, Seeds, and Legumes (4 to 5 servings per week)  Eat More Often: All without added salt.   Eat Less Often/Avoid: Salted nuts and seeds, canned beans with added salt.  Fats and Sweets (limited)  Eat More Often: Vegetable oils, tub margarines without trans fats, sugar-free gelatin. Mayonnaise and salad dressings.   Eat Less Often/Avoid: Coconut oils, palm oils, butter, stick margarine, cream, half and half, cookies, candy, pie.  FOR MORE INFORMATION The Dash Diet Eating Plan: www.dashdiet.org Document Released: 04/12/2011 Document Reviewed: 04/02/2011 [facility] Patient Information 2012 [place], [place].

## 2011-11-02 ENCOUNTER — Telehealth: Payer: Self-pay | Admitting: *Deleted

## 2011-11-02 MED ORDER — OXYBUTYNIN CHLORIDE 5 MG PO TABS: 5.0000 mg | ORAL_TABLET | Freq: Three times a day (TID) | ORAL | Status: DC

## 2011-11-02 NOTE — Telephone Encounter (Signed)
Ok TID  #90

## 2011-11-02 NOTE — Telephone Encounter (Signed)
Patient is requesting that the Rx for Oxybutynin be increased from bid to tid is this okay to fill?

## 2011-11-12 ENCOUNTER — Telehealth: Payer: Self-pay | Admitting: Internal Medicine

## 2011-11-12 NOTE — Telephone Encounter (Signed)
Caller: Adline/Patient; PCP: Eleonore Chiquito; CB#: [phone]; Call regarding episodes of Bradycardia With Hypotension;  Onset: 10/25/11, BP 83/54, P 56 with fatigue/ weakness.  Pulse 48 on 11/11/11 and 70 on 11/12/11 with NL BP. BP 86/53 and P 56-57 on 11/06/11   Advised to see MD now for taking calcium channel blocker and thyroid medication and continues to have ongoing or repeated episodes of irregular pulse or less than 50 beats/min per Irregular Heartbeat Guideline.  May be able to get ride to office 11/12/11.  No appts remain with Dr Amador Cunas for 11/12/11.  Info noted and sent to [facility] for call back to pt.

## 2011-11-12 NOTE — Telephone Encounter (Signed)
noted 

## 2011-11-12 NOTE — Telephone Encounter (Signed)
Forward to Dr. Amador Cunas for [facility]

## 2011-11-12 NOTE — Telephone Encounter (Signed)
Pt called and said that she had called and spoke to [facility] said that they would call her back in , but pt did not rcv call back. She said that she does not have any transportation to come in for an appt today or tomorrow. Pt just wanted to make Dr Amador Cunas aware.

## 2011-11-12 NOTE — Telephone Encounter (Signed)
Please advise. Thank you

## 2011-11-12 NOTE — Telephone Encounter (Signed)
Pt called and said that she will have to check with her son, before she can sch appt, because he provides her transportation. Pt will call back to sch ov. Just wanted to make pcp aware.

## 2011-11-12 NOTE — Telephone Encounter (Signed)
D/c diltiazem;  Needs ROV

## 2011-11-12 NOTE — Telephone Encounter (Signed)
Attempt to call- Vm - LMTCB , gave dr. Vernon Prey instructions and needs to call for rov this week.

## 2011-11-12 NOTE — Telephone Encounter (Signed)
She can be scheduled with another provider.

## 2011-11-14 ENCOUNTER — Ambulatory Visit: Payer: Medicare Other | Admitting: Internal Medicine

## 2011-11-15 ENCOUNTER — Ambulatory Visit (INDEPENDENT_AMBULATORY_CARE_PROVIDER_SITE_OTHER): Payer: Medicare Other | Admitting: Internal Medicine

## 2011-11-15 ENCOUNTER — Encounter: Payer: Self-pay | Admitting: Internal Medicine

## 2011-11-15 VITALS — BP 114/70 | HR 74 | Wt 179.0 lb

## 2011-11-15 DIAGNOSIS — I4891 Unspecified atrial fibrillation: Secondary | ICD-10-CM

## 2011-11-15 DIAGNOSIS — I10 Essential (primary) hypertension: Secondary | ICD-10-CM

## 2011-11-15 MED ORDER — DILTIAZEM HCL ER COATED BEADS 120 MG PO CP24: 120.0000 mg | ORAL_CAPSULE | Freq: Every day | ORAL | Status: DC

## 2011-11-15 NOTE — Patient Instructions (Signed)
Limit your sodium (Salt) intake  Please check your blood pressure on a regular basis.  If it is consistently greater than 150/90, please make an office appointment.  Return in 3 months for follow-up  

## 2011-11-15 NOTE — Progress Notes (Signed)
Subjective:    Patient ID: Tonya Willis, female    DOB: March 20, 1935, 77 y.o.   MRN: [id number]  HPI  77 year old patient who is in today for followup. She has a history of paroxysmal atrial fibrillation and has been on diltiazem 240 mg daily. Recently she was noted to have hypotension and bradycardia and was quite symptomatic with weakness and dizziness. This medication was discontinued and today she feels quite well. In fact she states that she has more energy than she has in some time.  Past Medical History  Diagnosis Date  . ALLERGIC RHINITIS 04/14/2010  . Atrial fibrillation 04/14/2010  . CEREBROVASCULAR ACCIDENT, HX OF 04/14/2010  . COPD 04/14/2010  . CORONARY ARTERY DISEASE 04/14/2010  . GERD 04/14/2010  . HYPERLIPIDEMIA 04/14/2010  . HYPERTENSION 04/14/2010  . HYPOTHYROIDISM 04/14/2010  . OSTEOARTHRITIS 04/14/2010  . PEPTIC ULCER DISEASE 04/14/2010    History   Social History  . Marital Status: Widowed    Spouse Name: N/A    Number of Children: N/A  . Years of Education: N/A   Occupational History  . Not on file.   Social History Main Topics  . Smoking status: Former Smoker    Quit date: 05/08/1991  . Smokeless tobacco: Never Used  . Alcohol Use: No  . Drug Use: No  . Sexually Active: Not on file   Other Topics Concern  . Not on file   Social History Narrative   Occupation:  retired CNAWidow/Widower    Past Surgical History  Procedure Date  . Appendectomy   . Cholecystectomy   . Breast biopsy     Family History  Problem Relation Age of Onset  . Heart attack    . Heart failure    . Pulmonary fibrosis    . Cancer    . Heart failure Mother   . Heart disease Father     first MI age 75    Allergies  Allergen Reactions  . Ambien (Zolpidem Tartrate) Other (See Comments)    Severe sleep walking  . Codeine     REACTION: hallucinate  . Erythromycin     REACTION: hives  . Morphine Other (See Comments)    Pt doesn't remember  . Sulfonamide Derivatives     REACTION: SOB    Current Outpatient Prescriptions on File Prior to Visit  Medication Sig Dispense Refill  . acetaminophen (TYLENOL) 500 MG tablet Take 500 mg by mouth every 6 (six) hours as needed.      Marland Kitchen aspirin 81 MG tablet Pt doesn't take asa every day because it irritates her stomach      . Calcium Carbonate (CALCIUM 500 PO) Take 2 tablets by mouth daily.        . diphenhydrAMINE (BENADRYL) 25 MG tablet Take 25 mg by mouth every 6 (six) hours as needed.      . fluticasone (FLOVENT HFA) 110 MCG/ACT inhaler Inhale 2 puffs into the lungs 2 (two) times daily.  1 Inhaler  6  . Glucosamine-Chondroit-Vit C-Mn (GLUCOSAMINE 1500 COMPLEX PO) Take 1 tablet by mouth 2 (two) times daily.        Marland Kitchen guaiFENesin (MUCINEX) 600 MG 12 hr tablet Take 1,200 mg by mouth 2 (two) times daily.      Marland Kitchen levalbuterol (XOPENEX HFA) 45 MCG/ACT inhaler Inhale 1-2 puffs into the lungs every 4 (four) hours as needed.  1 Inhaler  6  . levothyroxine (SYNTHROID, LEVOTHROID) 125 MCG tablet Take 1 tablet (125 mcg total) by mouth daily.  90  tablet  6  . loperamide (IMODIUM) 2 MG capsule Take 2 mg by mouth 4 (four) times daily as needed.      . nitroGLYCERIN (NITROSTAT) 0.4 MG SL tablet Place 1 tablet (0.4 mg total) under the tongue every 5 (five) minutes as needed for chest pain.  25 tablet  3  . Omega-3 Fatty Acids (FISH OIL) 1000 MG CAPS Take 1 capsule by mouth 3 (three) times daily.       Marland Kitchen omeprazole (PRILOSEC) 20 MG capsule Take 20 mg by mouth daily.      Marland Kitchen oxybutynin (DITROPAN) 5 MG tablet Take 1 tablet (5 mg total) by mouth 3 (three) times daily.  90 tablet  5  . Simethicone (GAS-X PO) Take by mouth. Take 1 tab as needed      . simvastatin (ZOCOR) 20 MG tablet Take 1 tablet (20 mg total) by mouth at bedtime.  90 tablet  6  . St Johns Wort 300 MG CAPS Take by mouth. daily        BP 114/70  Pulse 74  Wt 179 lb (81.194 kg)  SpO2 95%       Review of Systems  Constitutional: Positive for fatigue.  Neurological:  Positive for weakness and light-headedness.       Objective:   Physical Exam  Constitutional: She is oriented to person, place, and time. She appears well-developed and well-nourished. No distress.       Blood pressure 120/74  HENT:  Head: Normocephalic.  Right Ear: External ear normal.  Left Ear: External ear normal.  Mouth/Throat: Oropharynx is clear and moist.  Eyes: Conjunctivae and EOM are normal. Pupils are equal, round, and reactive to light.  Neck: Normal range of motion. Neck supple. No thyromegaly present.  Cardiovascular: Normal rate, regular rhythm, normal heart sounds and intact distal pulses.   Pulmonary/Chest: Effort normal and breath sounds normal.  Abdominal: Soft. Bowel sounds are normal. She exhibits no mass. There is no tenderness.  Musculoskeletal: Normal range of motion.  Lymphadenopathy:    She has no cervical adenopathy.  Neurological: She is alert and oriented to person, place, and time.  Skin: Skin is warm and dry. No rash noted.  Psychiatric: She has a normal mood and affect. Her behavior is normal.          Assessment & Plan:   Paroxysmal atrial fibrillation Bradycardia and hypotension associated with diltiazem  Options were discussed. The patient continues to have episodes of PAF. Will decrease diltiazem to 120 daily and observe

## 2012-01-28 ENCOUNTER — Ambulatory Visit (INDEPENDENT_AMBULATORY_CARE_PROVIDER_SITE_OTHER): Payer: Medicare Other | Admitting: Internal Medicine

## 2012-01-28 ENCOUNTER — Encounter: Payer: Self-pay | Admitting: Internal Medicine

## 2012-01-28 VITALS — BP 122/80 | Temp 98.2°F | Wt 177.0 lb

## 2012-01-28 DIAGNOSIS — I10 Essential (primary) hypertension: Secondary | ICD-10-CM

## 2012-01-28 DIAGNOSIS — Z8679 Personal history of other diseases of the circulatory system: Secondary | ICD-10-CM

## 2012-01-28 DIAGNOSIS — R1310 Dysphagia, unspecified: Secondary | ICD-10-CM

## 2012-01-28 DIAGNOSIS — I2510 Atherosclerotic heart disease of native coronary artery without angina pectoris: Secondary | ICD-10-CM

## 2012-01-28 DIAGNOSIS — Z23 Encounter for immunization: Secondary | ICD-10-CM

## 2012-01-28 NOTE — Patient Instructions (Signed)
Continue Prilosec  GI referral as discussed  Return in 3 months for follow-up

## 2012-01-28 NOTE — Progress Notes (Signed)
Subjective:    Patient ID: Tonya Willis, female    DOB: 1935-04-17, 77 y.o.   MRN: [id number]  HPI   77 year old patient who presents today with a chief complaint of swallowing difficulty. This has been present for about 2 months and occurs mainly with some of her larger capsules of her medications. Denies much difficulty with solids although she states that she must chew very carefully she states there is occasional discomfort with swallowing. No constitutional complaints such as anorexia or weight loss. In general she feels well. She does have a history of cerebrovascular disease and also a history of a hiatal hernia requiring surgery in 1994. She states that she also has a small esophageal diverticulum. No vomiting No prior screening colonoscopies. This had been scheduled in the past but postponed while she was on Plavix therapy at the present time she is on aspirin therapy only; she is also on chronic PPI therapy  Past Medical History  Diagnosis Date  . ALLERGIC RHINITIS 04/14/2010  . Atrial fibrillation 04/14/2010  . CEREBROVASCULAR ACCIDENT, HX OF 04/14/2010  . COPD 04/14/2010  . CORONARY ARTERY DISEASE 04/14/2010  . GERD 04/14/2010  . HYPERLIPIDEMIA 04/14/2010  . HYPERTENSION 04/14/2010  . HYPOTHYROIDISM 04/14/2010  . OSTEOARTHRITIS 04/14/2010  . PEPTIC ULCER DISEASE 04/14/2010    History   Social History  . Marital Status: Widowed    Spouse Name: N/A    Number of Children: N/A  . Years of Education: N/A   Occupational History  . Not on file.   Social History Main Topics  . Smoking status: Former Smoker    Quit date: 05/08/1991  . Smokeless tobacco: Never Used  . Alcohol Use: No  . Drug Use: No  . Sexually Active: Not on file   Other Topics Concern  . Not on file   Social History Narrative   Occupation:  retired CNAWidow/Widower    Past Surgical History  Procedure Date  . Appendectomy   . Cholecystectomy   . Breast biopsy     Family History  Problem Relation Age  of Onset  . Heart attack    . Heart failure    . Pulmonary fibrosis    . Cancer    . Heart failure Mother   . Heart disease Father     first MI age 8s    Allergies  Allergen Reactions  . Ambien (Zolpidem Tartrate) Other (See Comments)    Severe sleep walking  . Codeine     REACTION: hallucinate  . Erythromycin     REACTION: hives  . Morphine Other (See Comments)    Pt doesn't remember  . Sulfonamide Derivatives     REACTION: SOB    Current Outpatient Prescriptions on File Prior to Visit  Medication Sig Dispense Refill  . acetaminophen (TYLENOL) 500 MG tablet Take 500 mg by mouth every 6 (six) hours as needed.      Marland Kitchen aspirin 81 MG tablet Pt doesn't take asa every day because it irritates her stomach      . Calcium Carbonate (CALCIUM 500 PO) Take 2 tablets by mouth daily.        Marland Kitchen diltiazem (CARDIZEM CD) 120 MG 24 hr capsule Take 1 capsule (120 mg total) by mouth daily.  90 capsule  3  . diphenhydrAMINE (BENADRYL) 25 MG tablet Take 25 mg by mouth every 6 (six) hours as needed.      . fluticasone (FLOVENT HFA) 110 MCG/ACT inhaler Inhale 2 puffs into the lungs 2 (two)  times daily.  1 Inhaler  6  . Glucosamine-Chondroit-Vit C-Mn (GLUCOSAMINE 1500 COMPLEX PO) Take 1 tablet by mouth 2 (two) times daily.        Marland Kitchen guaiFENesin (MUCINEX) 600 MG 12 hr tablet Take 1,200 mg by mouth 2 (two) times daily.      Marland Kitchen levalbuterol (XOPENEX HFA) 45 MCG/ACT inhaler Inhale 1-2 puffs into the lungs every 4 (four) hours as needed.  1 Inhaler  6  . levothyroxine (SYNTHROID, LEVOTHROID) 125 MCG tablet Take 1 tablet (125 mcg total) by mouth daily.  90 tablet  6  . loperamide (IMODIUM) 2 MG capsule Take 2 mg by mouth 4 (four) times daily as needed.      . nitroGLYCERIN (NITROSTAT) 0.4 MG SL tablet Place 1 tablet (0.4 mg total) under the tongue every 5 (five) minutes as needed for chest pain.  25 tablet  3  . Omega-3 Fatty Acids (FISH OIL) 1000 MG CAPS Take 1 capsule by mouth 3 (three) times daily.       Marland Kitchen  omeprazole (PRILOSEC) 20 MG capsule Take 20 mg by mouth daily.      Marland Kitchen oxybutynin (DITROPAN) 5 MG tablet Take 1 tablet (5 mg total) by mouth 3 (three) times daily.  90 tablet  5  . Simethicone (GAS-X PO) Take by mouth. Take 1 tab as needed      . simvastatin (ZOCOR) 20 MG tablet Take 1 tablet (20 mg total) by mouth at bedtime.  90 tablet  6  . St Johns Wort 300 MG CAPS Take by mouth. daily        BP 122/80  Temp 98.2 F (36.8 C) (Oral)  Wt 177 lb (80.287 kg)     Wt Readings from Last 3 Encounters:  01/28/12 177 lb (80.287 kg)  11/15/11 179 lb (81.194 kg)  10/19/11 177 lb (80.287 kg)    Review of Systems  Gastrointestinal: Negative.        Objective:   Physical Exam  Constitutional: She is oriented to person, place, and time. She appears well-developed and well-nourished.  HENT:  Head: Normocephalic.  Right Ear: External ear normal.  Left Ear: External ear normal.  Mouth/Throat: Oropharynx is clear and moist.  Eyes: Conjunctivae normal and EOM are normal. Pupils are equal, round, and reactive to light.  Neck: Normal range of motion. Neck supple. No thyromegaly present.  Cardiovascular: Normal rate, regular rhythm, normal heart sounds and intact distal pulses.   Pulmonary/Chest: Effort normal and breath sounds normal.  Abdominal: Soft. Bowel sounds are normal. She exhibits no mass. There is no tenderness.  Musculoskeletal: Normal range of motion.  Lymphadenopathy:    She has no cervical adenopathy.  Neurological: She is alert and oriented to person, place, and time.  Skin: Skin is warm and dry. No rash noted.  Psychiatric: She has a normal mood and affect. Her behavior is normal.          Assessment & Plan:   Dyspagia-  Options were discussed includiong Ba swallow , swallowing study with ST- prefers GI referral HTN stable

## 2012-02-07 ENCOUNTER — Telehealth: Payer: Self-pay | Admitting: Internal Medicine

## 2012-02-07 DIAGNOSIS — R1310 Dysphagia, unspecified: Secondary | ICD-10-CM

## 2012-02-07 NOTE — Telephone Encounter (Signed)
[place]  GI referral

## 2012-02-07 NOTE — Telephone Encounter (Signed)
please advise - was just in office - was this discussed? Dx?for referral

## 2012-02-07 NOTE — Telephone Encounter (Signed)
Pt states that she was supposed to be referred to a GI consult. Pt prefers not to be referred to [facility]

## 2012-02-08 ENCOUNTER — Telehealth: Payer: Self-pay | Admitting: Internal Medicine

## 2012-02-08 NOTE — Telephone Encounter (Signed)
Pt called and said to pls call back on pts cell phone # re: referral infor.

## 2012-02-08 NOTE — Telephone Encounter (Signed)
lmom for pt to call back

## 2012-02-12 ENCOUNTER — Telehealth: Payer: Self-pay | Admitting: Gastroenterology

## 2012-02-12 ENCOUNTER — Telehealth: Payer: Self-pay | Admitting: Family Medicine

## 2012-02-12 NOTE — Telephone Encounter (Signed)
Tonya Willis - can we call to see if they cal see her quicker - difficulty swallowing - Nov. Is to long

## 2012-02-12 NOTE — Telephone Encounter (Signed)
Pt had a referral to GI. She states they called her and cannot see her until last day in November. She says she is not going to wait that long. She wants to know if a referral to ENT would be better? States she is having difficulty w/swallowing her food, and cannot wait til end of November. Please advise and call pt back. Thank you.

## 2012-02-12 NOTE — Telephone Encounter (Signed)
No answer and no machine I will continue to try and reach the patient. 

## 2012-02-13 NOTE — Telephone Encounter (Signed)
Patient is scheduled for 02/15/12 10:30 with Willette Cluster RNP.  Patient reports a several week history of dysphagia.  She is now having trouble with her medications.  "I think that I have a tumor".  She reports a lump in her throat.

## 2012-02-13 NOTE — Telephone Encounter (Signed)
Pt left me vm stating she called Gi and they got her a sooner appt. She is scheduled for tomorrow

## 2012-02-14 ENCOUNTER — Encounter: Payer: Self-pay | Admitting: Gastroenterology

## 2012-02-14 NOTE — Telephone Encounter (Signed)
noted 

## 2012-02-15 ENCOUNTER — Ambulatory Visit: Payer: Medicare Other | Admitting: Internal Medicine

## 2012-02-15 ENCOUNTER — Ambulatory Visit (INDEPENDENT_AMBULATORY_CARE_PROVIDER_SITE_OTHER): Payer: Medicare Other | Admitting: Nurse Practitioner

## 2012-02-15 ENCOUNTER — Encounter: Payer: Self-pay | Admitting: Nurse Practitioner

## 2012-02-15 VITALS — BP 130/72 | HR 72 | Ht 63.0 in | Wt 177.8 lb

## 2012-02-15 DIAGNOSIS — M542 Cervicalgia: Secondary | ICD-10-CM

## 2012-02-15 DIAGNOSIS — R1310 Dysphagia, unspecified: Secondary | ICD-10-CM | POA: Insufficient documentation

## 2012-02-15 NOTE — Patient Instructions (Addendum)
Please go to the basement level to have your labs drawn.   We have ordered the CT scan of the Neck.  You have been scheduled for a CT scan of the Neck at [facility] ([address]---this is in the same building as Architectural technologist).   You are scheduled on 02-20-2012  At 2:00  PM  You should arrive at 1: 45 PM  to your appointment time for registration. Please follow the written instructions below on the day of your exam:  WARNING: IF YOU ARE ALLERGIC TO IODINE/X-RAY DYE, PLEASE NOTIFY RADIOLOGY IMMEDIATELY AT [phone]! YOU WILL BE GIVEN A 13 HOUR PREMEDICATION PREP.  1) Do not eat or drink anything after 12:00  Noon (2 hours prior to your test)  You may take any medications as prescribed with a small amount of water except for the following: Metformin, Glucophage, Glucovance, Avandamet, Riomet, Fortamet, Actoplus Met, Janumet, Glumetza or Metaglip. The above medications must be held the day of the exam AND 48 hours after the exam.  The purpose of you drinking the oral contrast is to aid in the visualization of your intestinal tract. The contrast solution may cause some diarrhea. Before your exam is started, you will be given a small amount of fluid to drink. Depending on your individual set of symptoms, you may also receive an intravenous injection of x-ray contrast/dye. Plan on being at [facility] for 30 minutes or long, depending on the type of exam you are having performed.  If you have any questions regarding your exam or if you need to reschedule, you may call the CT department at [phone] between the hours of 8:00 am and 5:00 pm, Monday-Friday.  ________________________________________________________________________

## 2012-02-16 ENCOUNTER — Encounter: Payer: Self-pay | Admitting: Nurse Practitioner

## 2012-02-16 NOTE — Progress Notes (Addendum)
02/16/2012 Tonya Willis [id number] 12/16/1934   HISTORY OF PRESENT ILLNESS: Patient is a 77 year old female, new to this practice, referred by PCP for evaluation of dysphagia. Patient tells me that she has a growth in her throat..The growth is in the front of her neck. The area is sometimes tender to touch. Within the last couple of month she has begun having problems with solid food dysphagia as well.Patient feels slightly short of breath when lying down at night. No heartburn. Patient gives a history of Barrett's esophagus and a Nissen fundoplication in the 1990's. She was started on a PPI several years ago following cardiac stent placement. No heartburn. She hasn't had a surveillance EGD in many, many years. Patient hasn't wanted to live her life concerned about possibility of getting esophageal cancer. No unusual weight loss.    Past Medical History  Diagnosis Date  . ALLERGIC RHINITIS 04/14/2010  . Atrial fibrillation 04/14/2010  . CEREBROVASCULAR ACCIDENT, HX OF 04/14/2010  . COPD 04/14/2010  . CORONARY ARTERY DISEASE 04/14/2010  . GERD 04/14/2010  . HYPERLIPIDEMIA 04/14/2010  . HYPERTENSION 04/14/2010  . HYPOTHYROIDISM 04/14/2010  . OSTEOARTHRITIS 04/14/2010  . PEPTIC ULCER DISEASE 04/14/2010  . Gallstones   . Asthma   . Barrett's esophagus    Past Surgical History  Procedure Date  . Appendectomy   . Cholecystectomy   . Breast biopsy   . Coronary stent placement   . Vesicovaginal fistula closure w/ tah     reports that she quit smoking about 20 years ago. She has never used smokeless tobacco. She reports that she does not drink alcohol or use illicit drugs. family history includes Cancer in an unspecified family member; Heart attack in an unspecified family member; Heart disease in her father; Heart failure in her mother and unspecified family member; Melanoma in her daughter; and Pulmonary fibrosis in an unspecified family member. Allergies  Allergen Reactions  . Ambien (Zolpidem  Tartrate) Other (See Comments)    Severe sleep walking  . Codeine     REACTION: hallucinate  . Erythromycin     REACTION: hives  . Morphine Other (See Comments)    Pt doesn't remember  . Sulfonamide Derivatives     REACTION: SOB      Outpatient Encounter Prescriptions as of 02/15/2012  Medication Sig Dispense Refill  . aspirin 81 MG tablet Pt doesn't take asa every day because it irritates her stomach      . Calcium Carbonate (CALCIUM 500 PO) Take 2 tablets by mouth daily.        Marland Kitchen diltiazem (CARDIZEM CD) 120 MG 24 hr capsule Take 1 capsule (120 mg total) by mouth daily.  90 capsule  3  . diphenhydrAMINE (BENADRYL) 25 MG tablet Take 25 mg by mouth every 6 (six) hours as needed.      . Glucosamine-Chondroit-Vit C-Mn (GLUCOSAMINE 1500 COMPLEX PO) Take 1 tablet by mouth 2 (two) times daily.        Marland Kitchen guaiFENesin (MUCINEX) 600 MG 12 hr tablet Take 1,200 mg by mouth 2 (two) times daily.      Marland Kitchen levothyroxine (SYNTHROID, LEVOTHROID) 125 MCG tablet Take 1 tablet (125 mcg total) by mouth daily.  90 tablet  6  . loperamide (IMODIUM) 2 MG capsule Take 2 mg by mouth 4 (four) times daily as needed.      . nitroGLYCERIN (NITROSTAT) 0.4 MG SL tablet Place 1 tablet (0.4 mg total) under the tongue every 5 (five) minutes as needed for chest pain.  25 tablet  3  . Omega-3 Fatty Acids (FISH OIL) 1000 MG CAPS Take 1 capsule by mouth 3 (three) times daily.       Marland Kitchen omeprazole (PRILOSEC) 20 MG capsule Take 20 mg by mouth daily.      Marland Kitchen oxybutynin (DITROPAN) 5 MG tablet Take 1 tablet (5 mg total) by mouth 3 (three) times daily.  90 tablet  5  . Simethicone (GAS-X PO) Take by mouth. Take 1 tab as needed      . simvastatin (ZOCOR) 20 MG tablet Take 1 tablet (20 mg total) by mouth at bedtime.  90 tablet  6  . acetaminophen (TYLENOL) 500 MG tablet Take 500 mg by mouth every 6 (six) hours as needed.      . fluticasone (FLOVENT HFA) 110 MCG/ACT inhaler Inhale 2 puffs into the lungs 2 (two) times daily.  1 Inhaler  6    . levalbuterol (XOPENEX HFA) 45 MCG/ACT inhaler Inhale 1-2 puffs into the lungs every 4 (four) hours as needed.  1 Inhaler  6  . St Johns Wort 300 MG CAPS Take by mouth. daily         REVIEW OF SYSTEMS  : Right neck, right should and right upper extremity discomfort. All other systems reviewed and negative except where noted in the History of Present Illness.   PHYSICAL EXAM: BP 130/72  Pulse 72  Ht 5\' 3"  (1.6 m)  Wt 177 lb 12.8 oz (80.65 kg)  BMI 31.50 kg/m2 General: Well developed white female in no acute distress Head: Normocephalic and atraumatic Eyes:  sclerae anicteric,conjunctive pink. Ears: Normal auditory acuity Neck: Supple, no masses felt. Left sternocleidomastoid muscle more prominent on left side. Patient has a prominent sternal notch. No masses or enlarged lymph nodes felt. .  Lungs: Clear throughout to auscultation Heart: Regular rate and rhythm Abdomen: Soft, nontender, non distended. No masses or hepatomegaly noted. Normal bowel sounds Musculoskeletal: Symmetrical with no gross deformities  Skin: No lesions on visible extremities Extremities: No edema  Neurological: Alert oriented x 4, grossly nonfocal Cervical Nodes:  No significant cervical adenopathy Psychological:  Alert and cooperative. Normal mood and affect  ASSESSMENT AND PLAN:  40. 77 year old female with recent onset of solid food dysphagia. She calls my attention to a "growth" at base of anterior neck She has a prominent sternal notch but no mass is appreciated on exam. Patient complains of mild SOB when laying down at night. Will obtain a CTscan of the neck to evaluate for any masses. Patient may require an EGD at some point but it would be helpful to first define her anatomy since is is s/p remote [employer] fundoplication and she gives a history of an esophageal diverticulum. If CTscan of the neck is negative will get a barium swallow with tablet to define anatomy and also evaluate for strictures.    2.  GERD / Barrett's esophagus, per patient. She is s/p remote Nissen Fundoplication. Patient is on chronic PPI therapy. She hasn't had a surveillance EGD in many years.   3. CAD, s/p remote cardiac stent placement  4. Multiple medical problems, see PMH  Addendum: Reviewed and agree with initial management. Beverley Fiedler, MD

## 2012-02-19 ENCOUNTER — Telehealth: Payer: Self-pay | Admitting: *Deleted

## 2012-02-19 ENCOUNTER — Other Ambulatory Visit: Payer: Self-pay | Admitting: *Deleted

## 2012-02-19 DIAGNOSIS — M542 Cervicalgia: Secondary | ICD-10-CM

## 2012-02-19 NOTE — Telephone Encounter (Signed)
Spoke with patient and ask her about lab work that was to be done last week. She state she did not know about it. She also thought her CT was for today, Spoke with Rose and patient can have labs drawn at CT but she would need to be there at 11:00 for the labs. Patient will talk with her daughter and decide what to do and call me back.

## 2012-02-20 ENCOUNTER — Other Ambulatory Visit (INDEPENDENT_AMBULATORY_CARE_PROVIDER_SITE_OTHER): Payer: Medicare Other

## 2012-02-20 ENCOUNTER — Ambulatory Visit (INDEPENDENT_AMBULATORY_CARE_PROVIDER_SITE_OTHER)
Admission: RE | Admit: 2012-02-20 | Discharge: 2012-02-20 | Disposition: A | Discharge status: Home / Self Care | Payer: Medicare Other | Source: Ambulatory Visit | Attending: Nurse Practitioner | Admitting: Nurse Practitioner

## 2012-02-20 DIAGNOSIS — M542 Cervicalgia: Secondary | ICD-10-CM

## 2012-02-20 DIAGNOSIS — R1310 Dysphagia, unspecified: Secondary | ICD-10-CM

## 2012-02-20 LAB — BASIC METABOLIC PANEL
CO2: 25 mEq/L (ref 19–32)
Chloride: 110 mEq/L (ref 96–112)
Glucose, Bld: 98 mg/dL (ref 70–99)
Potassium: 4.1 mEq/L (ref 3.5–5.1)
Sodium: 141 mEq/L (ref 135–145)

## 2012-02-20 MED ORDER — IOHEXOL 300 MG/ML  SOLN
80.0000 mL | Freq: Once | INTRAMUSCULAR | Status: AC | PRN
  Administered 2012-02-20: 80 mL via INTRAVENOUS

## 2012-02-21 ENCOUNTER — Telehealth: Payer: Self-pay | Admitting: Internal Medicine

## 2012-02-21 NOTE — Telephone Encounter (Signed)
Pt wanted me to let you know that at her ct yesterday she was told that " bone in he neck" was a tumor. Pt states that she is feeling better but still having issues swallowing and is waiting to hear back from the doctor about the complete results from her ct.

## 2012-02-21 NOTE — Telephone Encounter (Signed)
Spoke with pt -informed of dr. Vernon Prey instructions - pt still feels there is something in her throat - I told her GI would f/u with her

## 2012-02-21 NOTE — Telephone Encounter (Signed)
GI ordered this test and will likely discuss with the patient. Notify patient that the CT of the neck revealed some arthritis in the spine but no tumor and no other abnormalities

## 2012-02-21 NOTE — Telephone Encounter (Signed)
Patient went for CT and labs on 02/20/12

## 2012-02-25 ENCOUNTER — Telehealth: Payer: Self-pay | Admitting: Internal Medicine

## 2012-02-25 DIAGNOSIS — R1310 Dysphagia, unspecified: Secondary | ICD-10-CM

## 2012-02-25 NOTE — Telephone Encounter (Signed)
Scheduled barium swallow evaluate for Zenker's diverticulum at [facility] radiology with Alisha on 02/28/12 at 9:15/9:30 AM. NPO after midnight. Patient notified.

## 2012-02-28 ENCOUNTER — Ambulatory Visit (HOSPITAL_COMMUNITY)
Admission: RE | Admit: 2012-02-28 | Discharge: 2012-02-28 | Disposition: A | Discharge status: Home / Self Care | Payer: Medicare Other | Source: Ambulatory Visit | Attending: Nurse Practitioner | Admitting: Nurse Practitioner

## 2012-02-28 DIAGNOSIS — R1310 Dysphagia, unspecified: Secondary | ICD-10-CM

## 2012-02-28 DIAGNOSIS — K225 Diverticulum of esophagus, acquired: Secondary | ICD-10-CM | POA: Insufficient documentation

## 2012-02-29 ENCOUNTER — Telehealth: Payer: Self-pay | Admitting: Nurse Practitioner

## 2012-03-03 NOTE — Telephone Encounter (Signed)
Patient asking about results of barium swallow. Please, advise.

## 2012-03-04 ENCOUNTER — Telehealth: Payer: Self-pay | Admitting: Nurse Practitioner

## 2012-03-04 DIAGNOSIS — R490 Dysphonia: Secondary | ICD-10-CM

## 2012-03-04 NOTE — Telephone Encounter (Signed)
Per Willette Cluster, NP , refer patient to ENT- hoarseness, s/p remote Nissen. Patient feels like there is a growth in her neck. Negative CT. Barium esophagram not concerning. Scheduled patient with Dr. Narda Bonds on 03/07/12 at 1:00 PM. Patient notified of appointment date and time of appointment. Records faxed.

## 2012-03-04 NOTE — Telephone Encounter (Signed)
I have spoken with patient about results.

## 2012-03-04 NOTE — Telephone Encounter (Signed)
I called patient with the results. She has known about both the diverticulum and dysmotility for years. She is still hoarse, still feels there is a "growth in her throat". We discussed referral to ENT, she is agreeable. Will forward this to her primary GI, Dr. Rhea Belton.

## 2012-03-04 NOTE — Telephone Encounter (Signed)
Patient is upset that she called Friday and no one has called her back.  She has called patient experience and has told them that she will go to [facility] if she does not hear from someone today.  She is wanting the results of a test she has had.  Patient is very nervous that something is wrong with her, per Arline Asp at [facility] Experience.

## 2012-03-18 ENCOUNTER — Ambulatory Visit: Payer: Medicare Other | Admitting: Cardiovascular Disease

## 2012-03-20 NOTE — Telephone Encounter (Signed)
Patient was referred to ENT

## 2012-04-17 ENCOUNTER — Ambulatory Visit: Payer: Medicare Other | Admitting: Internal Medicine

## 2012-04-23 ENCOUNTER — Other Ambulatory Visit: Payer: Self-pay | Admitting: Internal Medicine

## 2012-05-29 ENCOUNTER — Ambulatory Visit: Payer: Medicare Other | Admitting: Cardiovascular Disease

## 2012-06-17 ENCOUNTER — Encounter: Payer: Self-pay | Admitting: Internal Medicine

## 2012-06-17 ENCOUNTER — Ambulatory Visit (INDEPENDENT_AMBULATORY_CARE_PROVIDER_SITE_OTHER): Payer: Medicare Other | Admitting: Internal Medicine

## 2012-06-17 VITALS — BP 160/90 | HR 64 | Temp 97.7°F | Resp 18 | Wt 177.0 lb

## 2012-06-17 DIAGNOSIS — I10 Essential (primary) hypertension: Secondary | ICD-10-CM

## 2012-06-17 DIAGNOSIS — M542 Cervicalgia: Secondary | ICD-10-CM

## 2012-06-17 DIAGNOSIS — E039 Hypothyroidism, unspecified: Secondary | ICD-10-CM

## 2012-06-17 DIAGNOSIS — R1310 Dysphagia, unspecified: Secondary | ICD-10-CM

## 2012-06-17 DIAGNOSIS — I4891 Unspecified atrial fibrillation: Secondary | ICD-10-CM

## 2012-06-17 MED ORDER — OXYBUTYNIN CHLORIDE 5 MG PO TABS: ORAL_TABLET | ORAL | Status: AC

## 2012-06-17 MED ORDER — LEVALBUTEROL TARTRATE 45 MCG/ACT IN AERO: 1.0000 | INHALATION_SPRAY | RESPIRATORY_TRACT | Status: AC | PRN

## 2012-06-17 NOTE — Progress Notes (Signed)
Subjective:    Patient ID: Tonya Willis, female    DOB: 1934-09-06, 78 y.o.   MRN: [id number]  HPI  78 year old patient who has a history of paroxysmal atrial fibrillation as well as hypertension. Blood pressure has been quite labile with often slightly elevated readings but occasionally low. In the past diltiazem has been down titrated to 120 mg daily due to hypotension and bradycardia. Last year she had some dysphagia and neck pain which has been evaluated. GI ENT and CT of the neck have been nonrevealing. In general she is doing fairly well today.  Past Medical History  Diagnosis Date  . ALLERGIC RHINITIS 04/14/2010  . Atrial fibrillation 04/14/2010  . CEREBROVASCULAR ACCIDENT, HX OF 04/14/2010  . COPD 04/14/2010  . CORONARY ARTERY DISEASE 04/14/2010  . GERD 04/14/2010  . HYPERLIPIDEMIA 04/14/2010  . HYPERTENSION 04/14/2010  . HYPOTHYROIDISM 04/14/2010  . OSTEOARTHRITIS 04/14/2010  . PEPTIC ULCER DISEASE 04/14/2010  . Gallstones   . Asthma   . Barrett's esophagus     History   Social History  . Marital Status: Widowed    Spouse Name: N/A    Number of Children: 6  . Years of Education: N/A   Occupational History  . Retired    Social History Main Topics  . Smoking status: Former Smoker    Quit date: 05/08/1991  . Smokeless tobacco: Never Used  . Alcohol Use: No  . Drug Use: No  . Sexually Active: Not on file   Other Topics Concern  . Not on file   Social History Narrative   Occupation:  retired Scientist, forensic    Past Surgical History  Procedure Laterality Date  . Appendectomy    . Cholecystectomy    . Breast biopsy    . Coronary stent placement    . Vesicovaginal fistula closure w/ tah      Family History  Problem Relation Age of Onset  . Heart attack    . Heart failure    . Pulmonary fibrosis    . Cancer    . Heart failure Mother   . Heart disease Father     first MI age 38s  . Melanoma Daughter     1997    Allergies  Allergen Reactions  .  Ambien (Zolpidem Tartrate) Other (See Comments)    Severe sleep walking  . Codeine     REACTION: hallucinate  . Erythromycin     REACTION: hives  . Morphine Other (See Comments)    Pt doesn't remember  . Sulfonamide Derivatives     REACTION: SOB    Current Outpatient Prescriptions on File Prior to Visit  Medication Sig Dispense Refill  . acetaminophen (TYLENOL) 500 MG tablet Take 500 mg by mouth every 6 (six) hours as needed.      Marland Kitchen aspirin 81 MG tablet Pt doesn't take asa every day because it irritates her stomach      . Calcium Carbonate (CALCIUM 500 PO) Take 2 tablets by mouth daily.        Marland Kitchen diltiazem (CARDIZEM CD) 120 MG 24 hr capsule Take 1 capsule (120 mg total) by mouth daily.  90 capsule  3  . diphenhydrAMINE (BENADRYL) 25 MG tablet Take 25 mg by mouth every 6 (six) hours as needed.      . fluticasone (FLOVENT HFA) 110 MCG/ACT inhaler Inhale 2 puffs into the lungs 2 (two) times daily.  1 Inhaler  6  . Glucosamine-Chondroit-Vit C-Mn (GLUCOSAMINE 1500 COMPLEX PO) Take  1 tablet by mouth 2 (two) times daily.        Marland Kitchen guaiFENesin (MUCINEX) 600 MG 12 hr tablet Take 1,200 mg by mouth 2 (two) times daily.      Marland Kitchen levalbuterol (XOPENEX HFA) 45 MCG/ACT inhaler Inhale 1-2 puffs into the lungs every 4 (four) hours as needed.  1 Inhaler  6  . levothyroxine (SYNTHROID, LEVOTHROID) 125 MCG tablet Take 1 tablet (125 mcg total) by mouth daily.  90 tablet  6  . loperamide (IMODIUM) 2 MG capsule Take 2 mg by mouth 4 (four) times daily as needed.      . nitroGLYCERIN (NITROSTAT) 0.4 MG SL tablet Place 1 tablet (0.4 mg total) under the tongue every 5 (five) minutes as needed for chest pain.  25 tablet  3  . Omega-3 Fatty Acids (FISH OIL) 1000 MG CAPS Take 1 capsule by mouth 3 (three) times daily.       Marland Kitchen omeprazole (PRILOSEC) 20 MG capsule TAKE 1 CAPSULE BY MOUTH DAILY  90 capsule  0  . oxybutynin (DITROPAN) 5 MG tablet TAKE 1 TABLET BY MOUTH                  3 TIMES DAILY  90 tablet  0  .  Simethicone (GAS-X PO) Take by mouth. Take 1 tab as needed      . simvastatin (ZOCOR) 20 MG tablet Take 1 tablet (20 mg total) by mouth at bedtime.  90 tablet  6  . St Johns Wort 300 MG CAPS Take by mouth. daily      . [DISCONTINUED] oxybutynin (DITROPAN) 5 MG tablet Take 1 tablet (5 mg total) by mouth 3 (three) times daily.  90 tablet  5   No current facility-administered medications on file prior to visit.    BP 160/90  Pulse 64  Temp(Src) 97.7 F (36.5 C) (Oral)  Resp 18  Wt 177 lb (80.287 kg)  BMI 31.36 kg/m2  SpO2 95%       Review of Systems  Constitutional: Negative.   HENT: Negative for hearing loss, congestion, sore throat, rhinorrhea, dental problem, sinus pressure and tinnitus.   Eyes: Negative for pain, discharge and visual disturbance.  Respiratory: Negative for cough and shortness of breath.   Cardiovascular: Negative for chest pain, palpitations and leg swelling.  Gastrointestinal: Negative for nausea, vomiting, abdominal pain, diarrhea, constipation, blood in stool and abdominal distention.  Genitourinary: Negative for dysuria, urgency, frequency, hematuria, flank pain, vaginal bleeding, vaginal discharge, difficulty urinating, vaginal pain and pelvic pain.  Musculoskeletal: Negative for joint swelling, arthralgias and gait problem.  Skin: Negative for rash.  Neurological: Negative for dizziness, syncope, speech difficulty, weakness, numbness and headaches.  Hematological: Negative for adenopathy.  Psychiatric/Behavioral: Negative for behavioral problems, dysphoric mood and agitation. The patient is not nervous/anxious.        Objective:   Physical Exam  Constitutional: She is oriented to person, place, and time. She appears well-developed and well-nourished.  Blood pressure 140/82  HENT:  Head: Normocephalic.  Right Ear: External ear normal.  Left Ear: External ear normal.  Mouth/Throat: Oropharynx is clear and moist.  Eyes: Conjunctivae and EOM are  normal. Pupils are equal, round, and reactive to light.  Neck: Normal range of motion. Neck supple. No thyromegaly present.  Cardiovascular: Normal rate, regular rhythm, normal heart sounds and intact distal pulses.   Pulmonary/Chest: Effort normal and breath sounds normal.  Abdominal: Soft. Bowel sounds are normal. She exhibits no mass. There is no tenderness.  Musculoskeletal: Normal range of motion.  Lymphadenopathy:    She has no cervical adenopathy.  Neurological: She is alert and oriented to person, place, and time.  Skin: Skin is warm and dry. No rash noted.  Psychiatric: She has a normal mood and affect. Her behavior is normal.          Assessment & Plan:   Hypertension Paroxysmal atrial fibrillation Osteoarthritis Dyslipidemia  Medical regimen unchanged Medications updated  CPX 6 months

## 2012-06-17 NOTE — Patient Instructions (Signed)
Limit your sodium (Salt) intake    It is important that you exercise regularly, at least 20 minutes 3 to 4 times per week.  If you develop chest pain or shortness of breath seek  medical attention.  Return in 6 months for follow-up  

## 2012-07-07 ENCOUNTER — Encounter: Payer: Self-pay | Admitting: Internal Medicine

## 2012-07-07 ENCOUNTER — Ambulatory Visit (INDEPENDENT_AMBULATORY_CARE_PROVIDER_SITE_OTHER): Payer: Medicare Other | Admitting: Internal Medicine

## 2012-07-07 VITALS — BP 160/90 | HR 84 | Temp 98.0°F | Resp 20 | Wt 176.0 lb

## 2012-07-07 DIAGNOSIS — E785 Hyperlipidemia, unspecified: Secondary | ICD-10-CM

## 2012-07-07 DIAGNOSIS — R5383 Other fatigue: Secondary | ICD-10-CM

## 2012-07-07 DIAGNOSIS — I4891 Unspecified atrial fibrillation: Secondary | ICD-10-CM

## 2012-07-07 DIAGNOSIS — I10 Essential (primary) hypertension: Secondary | ICD-10-CM

## 2012-07-07 LAB — CBC WITH DIFFERENTIAL/PLATELET
Eosinophils Absolute: 0.5 10*3/uL (ref 0.0–0.7)
Eosinophils Relative: 5 % (ref 0.0–5.0)
MCV: 86.3 fl (ref 78.0–100.0)
Monocytes Absolute: 0.7 10*3/uL (ref 0.1–1.0)
Neutrophils Relative %: 56.8 % (ref 43.0–77.0)
Platelets: 260 10*3/uL (ref 150.0–400.0)
WBC: 9.4 10*3/uL (ref 4.5–10.5)

## 2012-07-07 MED ORDER — DILTIAZEM HCL ER COATED BEADS 120 MG PO CP24: 240.0000 mg | ORAL_CAPSULE | Freq: Every day | ORAL | Status: AC

## 2012-07-07 MED ORDER — OMEPRAZOLE 20 MG PO CPDR: DELAYED_RELEASE_CAPSULE | ORAL | Status: AC

## 2012-07-07 NOTE — Patient Instructions (Signed)
Limit your sodium (Salt) intake  Please check your blood pressure on a regular basis.  If it is consistently greater than 150/90, please make an office appointment.  Return in one month for follow-up  

## 2012-07-07 NOTE — Progress Notes (Signed)
Subjective:    Patient ID: Tonya Willis, female    DOB: [birth year], 78 y.o.   MRN: [id number]  HPI  78 year old patient who has a history of hypertension and paroxysmal atrial fibrillation. She presents today with a chief complaint of fatigue.  She has been on diltiazem for blood pressure control but has been down titrated do to symptomatic hypotension. Blood pressure last visit and today in the 160/90 range. She complains of the generalized fatigue. No focal cardiopulmonary complaints. She does have coronary artery disease as well as COPD.   BP Readings from Last 3 Encounters:  07/07/12 160/90  06/17/12 160/90  02/15/12 130/72    Past Medical History  Diagnosis Date  . ALLERGIC RHINITIS 04/14/2010  . Atrial fibrillation 04/14/2010  . CEREBROVASCULAR ACCIDENT, HX OF 04/14/2010  . COPD 04/14/2010  . CORONARY ARTERY DISEASE 04/14/2010  . GERD 04/14/2010  . HYPERLIPIDEMIA 04/14/2010  . HYPERTENSION 04/14/2010  . HYPOTHYROIDISM 04/14/2010  . OSTEOARTHRITIS 04/14/2010  . PEPTIC ULCER DISEASE 04/14/2010  . Gallstones   . Asthma   . Barrett's esophagus     History   Social History  . Marital Status: Widowed    Spouse Name: N/A    Number of Children: 6  . Years of Education: N/A   Occupational History  . Retired    Social History Main Topics  . Smoking status: Former Smoker    Quit date: 05/08/1991  . Smokeless tobacco: Never Used  . Alcohol Use: No  . Drug Use: No  . Sexually Active: Not on file   Other Topics Concern  . Not on file   Social History Narrative   Occupation:  retired Scientist, forensic    Past Surgical History  Procedure Laterality Date  . Appendectomy    . Cholecystectomy    . Breast biopsy    . Coronary stent placement    . Vesicovaginal fistula closure w/ tah      Family History  Problem Relation Age of Onset  . Heart attack    . Heart failure    . Pulmonary fibrosis    . Cancer    . Heart failure Mother   . Heart disease Father    first MI age 55s  . Melanoma Daughter     1997    Allergies  Allergen Reactions  . Ambien (Zolpidem Tartrate) Other (See Comments)    Severe sleep walking  . Codeine     REACTION: hallucinate  . Erythromycin     REACTION: hives  . Morphine Other (See Comments)    Pt doesn't remember  . Sulfonamide Derivatives     REACTION: SOB    Current Outpatient Prescriptions on File Prior to Visit  Medication Sig Dispense Refill  . acetaminophen (TYLENOL) 500 MG tablet Take 500 mg by mouth every 6 (six) hours as needed.      Marland Kitchen aspirin 81 MG tablet Pt doesn't take asa every day because it irritates her stomach      . Calcium Carbonate (CALCIUM 500 PO) Take 2 tablets by mouth daily.        Marland Kitchen diltiazem (CARDIZEM CD) 120 MG 24 hr capsule Take 1 capsule (120 mg total) by mouth daily.  90 capsule  3  . diphenhydrAMINE (BENADRYL) 25 MG tablet Take 25 mg by mouth every 6 (six) hours as needed.      . fluticasone (FLOVENT HFA) 110 MCG/ACT inhaler Inhale 2 puffs into the lungs 2 (two) times daily.  1 Inhaler  6  . Glucosamine-Chondroit-Vit C-Mn (GLUCOSAMINE 1500 COMPLEX PO) Take 1 tablet by mouth 2 (two) times daily.        Marland Kitchen guaiFENesin (MUCINEX) 600 MG 12 hr tablet Take 1,200 mg by mouth 2 (two) times daily.      Marland Kitchen levalbuterol (XOPENEX HFA) 45 MCG/ACT inhaler Inhale 1-2 puffs into the lungs every 4 (four) hours as needed.  1 Inhaler  6  . levothyroxine (SYNTHROID, LEVOTHROID) 125 MCG tablet Take 1 tablet (125 mcg total) by mouth daily.  90 tablet  6  . loperamide (IMODIUM) 2 MG capsule Take 2 mg by mouth 4 (four) times daily as needed.      . nitroGLYCERIN (NITROSTAT) 0.4 MG SL tablet Place 1 tablet (0.4 mg total) under the tongue every 5 (five) minutes as needed for chest pain.  25 tablet  3  . Omega-3 Fatty Acids (FISH OIL) 1000 MG CAPS Take 1 capsule by mouth 3 (three) times daily.       Marland Kitchen omeprazole (PRILOSEC) 20 MG capsule TAKE 1 CAPSULE BY MOUTH DAILY  90 capsule  0  . oxybutynin (DITROPAN) 5 MG  tablet TAKE 1 TABLET BY MOUTH                  3 TIMES DAILY  90 tablet  6  . Simethicone (GAS-X PO) Take by mouth. Take 1 tab as needed      . simvastatin (ZOCOR) 20 MG tablet Take 1 tablet (20 mg total) by mouth at bedtime.  90 tablet  6  . St Johns Wort 300 MG CAPS Take by mouth. daily      . [DISCONTINUED] oxybutynin (DITROPAN) 5 MG tablet Take 1 tablet (5 mg total) by mouth 3 (three) times daily.  90 tablet  5   No current facility-administered medications on file prior to visit.    BP 160/90  Pulse 84  Temp(Src) 98 F (36.7 C) (Oral)  Resp 20  Wt 176 lb (79.833 kg)  BMI 31.18 kg/m2  SpO2 94%       Review of Systems  Constitutional: Positive for fatigue.  HENT: Negative for hearing loss, congestion, sore throat, rhinorrhea, dental problem, sinus pressure and tinnitus.   Eyes: Negative for pain, discharge and visual disturbance.  Respiratory: Negative for cough and shortness of breath.   Cardiovascular: Negative for chest pain, palpitations and leg swelling.  Gastrointestinal: Negative for nausea, vomiting, abdominal pain, diarrhea, constipation, blood in stool and abdominal distention.  Genitourinary: Negative for dysuria, urgency, frequency, hematuria, flank pain, vaginal bleeding, vaginal discharge, difficulty urinating, vaginal pain and pelvic pain.  Musculoskeletal: Negative for joint swelling, arthralgias and gait problem.  Skin: Negative for rash.  Neurological: Negative for dizziness, syncope, speech difficulty, weakness, numbness and headaches.  Hematological: Negative for adenopathy.  Psychiatric/Behavioral: Negative for behavioral problems, dysphoric mood and agitation. The patient is not nervous/anxious.        Objective:   Physical Exam  Constitutional: She is oriented to person, place, and time. She appears well-developed and well-nourished.  HENT:  Head: Normocephalic.  Right Ear: External ear normal.  Left Ear: External ear normal.  Mouth/Throat:  Oropharynx is clear and moist.  Eyes: Conjunctivae and EOM are normal. Pupils are equal, round, and reactive to light.  Neck: Normal range of motion. Neck supple. No thyromegaly present.  Cardiovascular: Normal rate, regular rhythm, normal heart sounds and intact distal pulses.   Pulmonary/Chest: Effort normal and breath sounds normal.  Abdominal: Soft. Bowel sounds are normal. She  exhibits no mass. There is no tenderness.  Musculoskeletal: Normal range of motion.  Lymphadenopathy:    She has no cervical adenopathy.  Neurological: She is alert and oriented to person, place, and time.  Skin: Skin is warm and dry. No rash noted.  Psychiatric: She has a normal mood and affect. Her behavior is normal.          Assessment & Plan:      Fatique-  Will check TSH and update lab. HTN- incr diltiazem to 240  PAF-stable

## 2012-07-08 LAB — SEDIMENTATION RATE: Sed Rate: 10 mm/hr (ref 0–22)

## 2012-07-08 LAB — COMPREHENSIVE METABOLIC PANEL
AST: 31 U/L (ref 0–37)
Albumin: 3.7 g/dL (ref 3.5–5.2)
BUN: 16 mg/dL (ref 6–23)
CO2: 23 mEq/L (ref 19–32)
Calcium: 9.2 mg/dL (ref 8.4–10.5)
Chloride: 108 mEq/L (ref 96–112)
GFR: 83.83 mL/min (ref >60.00)
Glucose, Bld: 94 mg/dL (ref 70–99)
Potassium: 4.2 mEq/L (ref 3.5–5.1)

## 2012-08-04 ENCOUNTER — Ambulatory Visit: Payer: Medicare Other | Admitting: Internal Medicine

## 2012-11-17 ENCOUNTER — Other Ambulatory Visit: Payer: Self-pay | Admitting: Internal Medicine

## 2012-12-16 ENCOUNTER — Encounter: Payer: Medicare Other | Admitting: Internal Medicine

## 2014-09-02 IMAGING — RF DG ESOPHAGUS
14 of 24 series · 14 of 24 positions shown · non-contrast
Comparison: CT neck 02/10/2012

CLINICAL DATA: Dysphagia, trouble swallowing pills.  Concern for
[place] diverticulum.  History of Nissen fundoplication.

ESOPHOGRAM/BARIUM SWALLOW
TECHNIQUE: Combined double contrast and single contrast
examination performed using effervescent crystals, thick barium
liquid, and thin barium liquid.
Fluoroscopy time:  3.5 minutes.

[Series 1: run · 1 of 1 slices shown (1 of 14)]
[im 1/1]
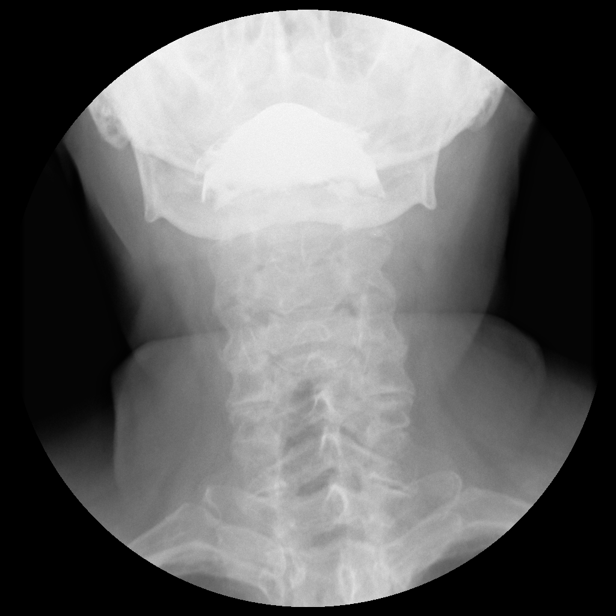

[Series 3: run · 1 of 11 slices shown (2 of 14)]
[im 1/11]
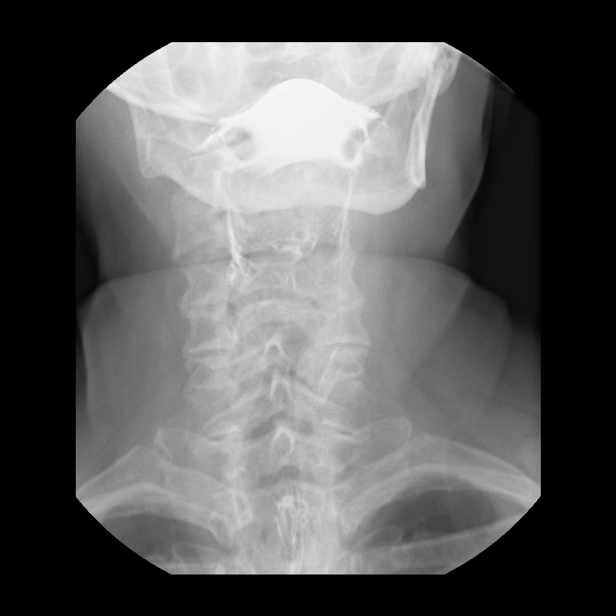

[Series 5: run · 1 of 1 slices shown (3 of 14)]
[im 1/1]
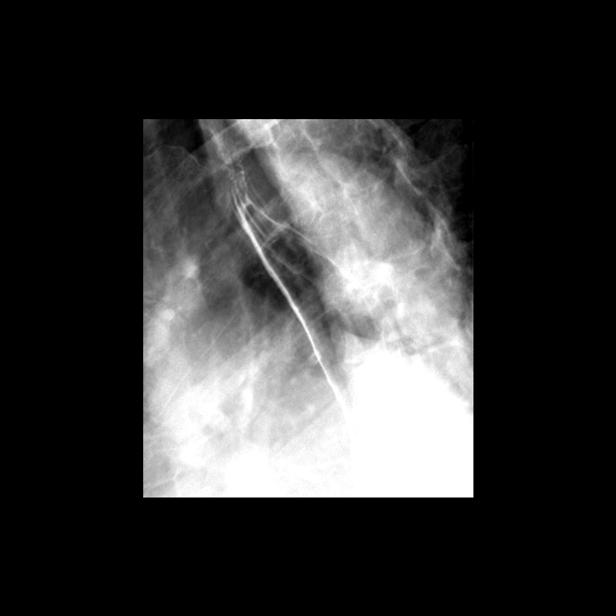

[Series 7: run · 1 of 1 slices shown (4 of 14)]
[im 1/1]
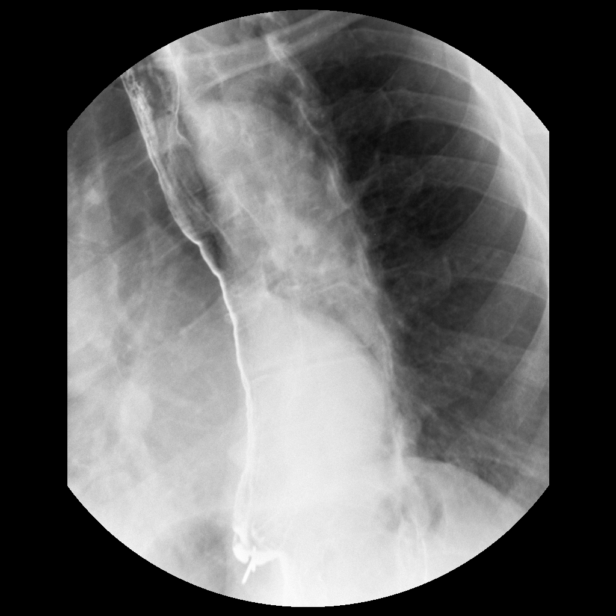

[Series 8: run · 1 of 1 slices shown (5 of 14)]
[im 1/1]
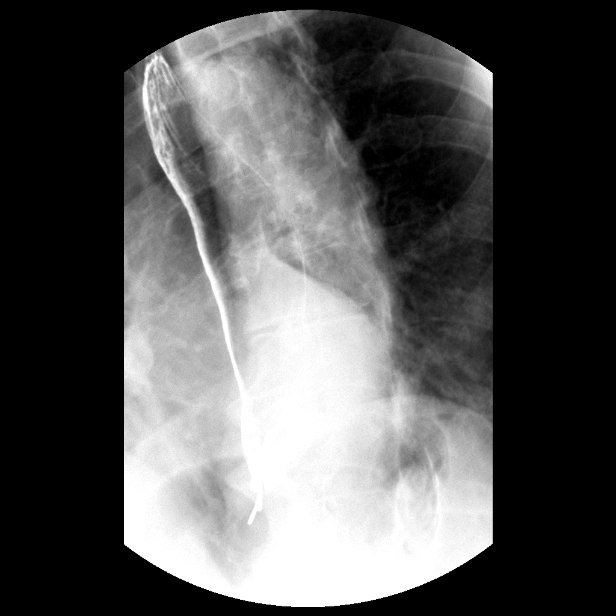

[Series 10: run · 1 of 1 slices shown (6 of 14)]
[im 1/1]
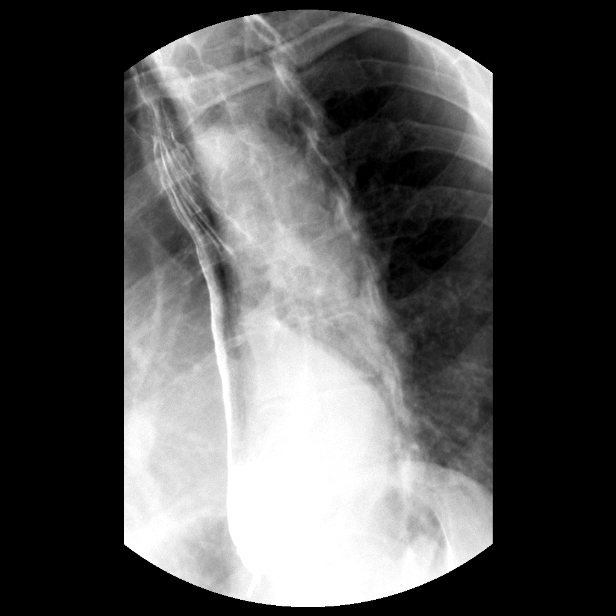

[Series 12: run · 1 of 1 slices shown (7 of 14)]
[im 1/1]
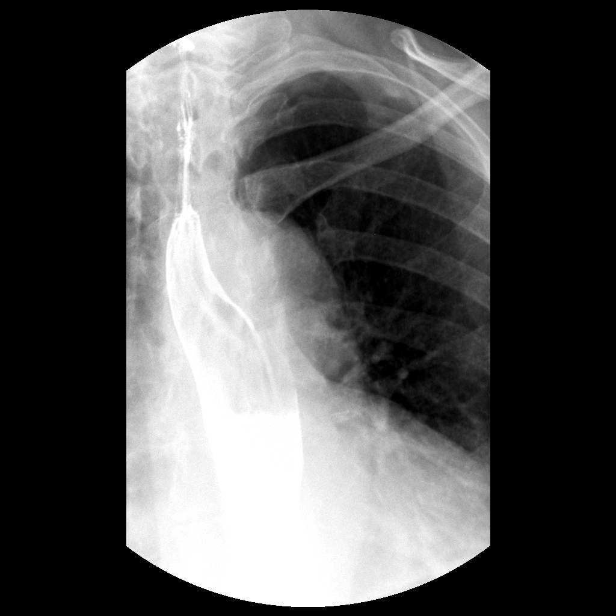

[Series 13: run · 1 of 1 slices shown (8 of 14)]
[im 1/1]
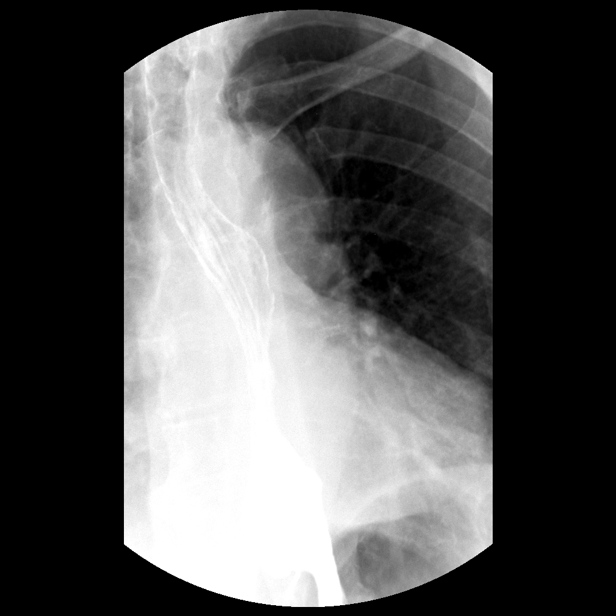

[Series 15: run · 1 of 1 slices shown (9 of 14)]
[im 1/1]
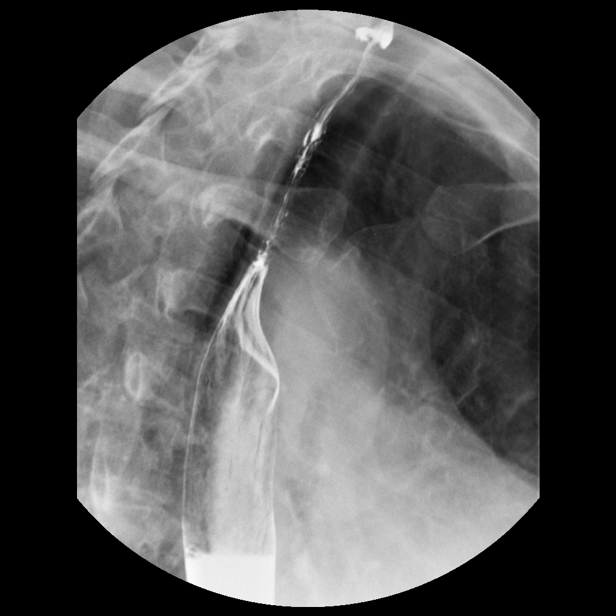

[Series 17: run · 1 of 1 slices shown (10 of 14)]
[im 1/1]
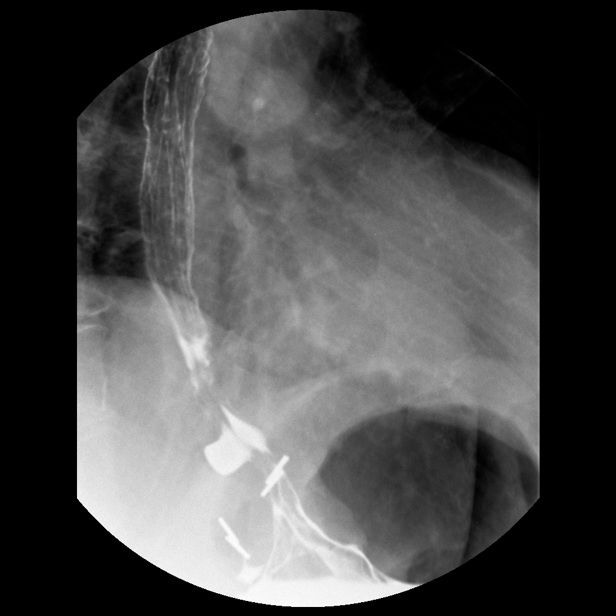

[Series 19: run · 1 of 1 slices shown (11 of 14)]
[im 1/1]
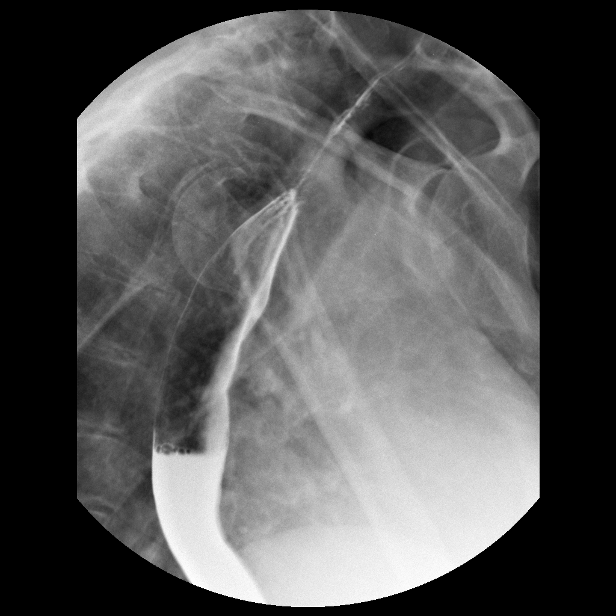

[Series 20: run · 1 of 1 slices shown (12 of 14)]
[im 1/1]
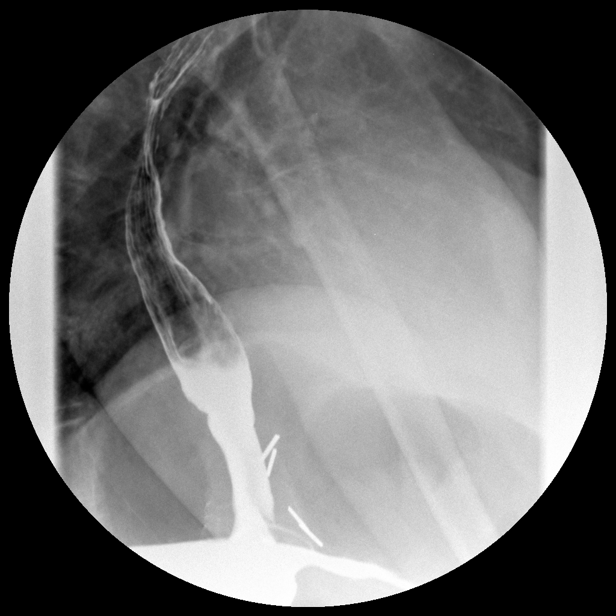

[Series 22: run · 1 of 1 slices shown (13 of 14)]
[im 1/1]
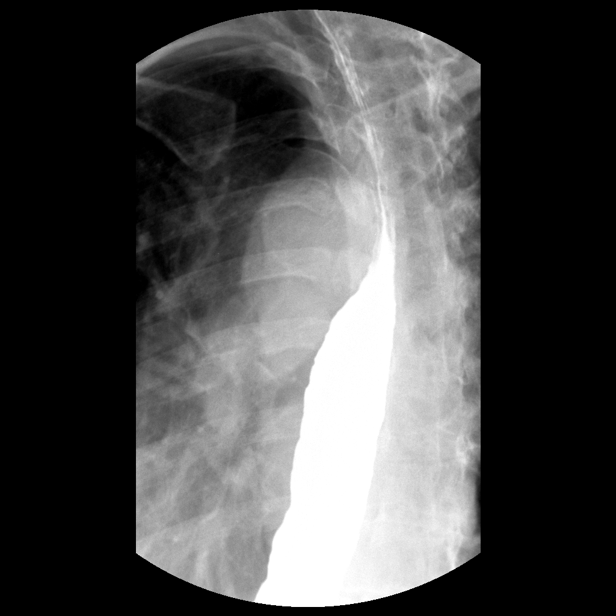

[Series 24: run · 1 of 1 slices shown (14 of 14)]
[im 1/1]
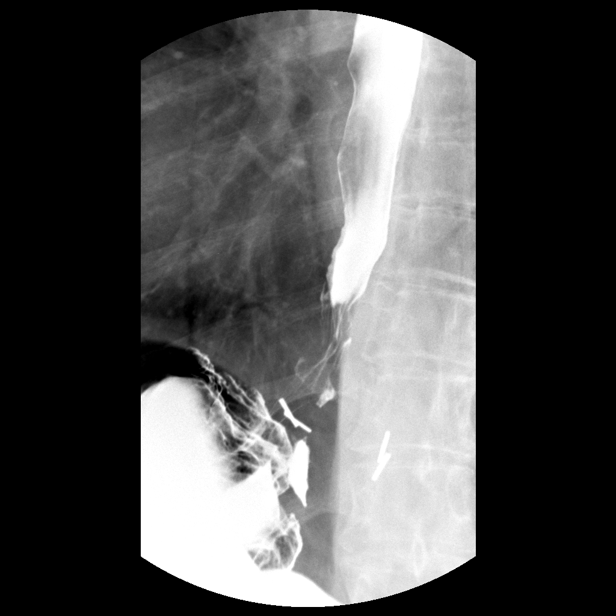

[14 of 24 positions shown; findings below may reference images not displayed]

FINDINGS: Evaluation of the hypopharynx and high cervical
esophagus demonstrates no esophageal diverticulum or web .  Normal
swallowing function is noted.  Evaluation of the thoracic and
distal esophagus demonstrates no mucosal irregularity or mass.
There is a circumferential indentation surrounding the distal
esophagus at the GE junction consistent with the Nissen
fundoplication wrap.

With the patient prone position, esophageal motility was assessed.
There was some escape of the barium bolus from the primary
stripping wave esophagus and poor clearance of the barium bolus
from the esophagus.  This was much more pronounced in the prone
position than the  upright position.  No hiatal hernia present.

13 mm barium tablet passed GE junction easily.  No reflux
demonstrated during coarse exam.

Of note, on the comparison CT of the neck (02/20/2012 there is a
small elongated diverticulum extending from the left aspect of the
esophagus just above the carina.  The orifice for this diverticulum
is very small at 2 mm.  The diverticula itself is thin and
elongated extending superior and inferior from the neck over 3 cm
paralleling the course the esophagus.  The diameter with a
diverticulum is only 2-3 mm.  This was not demonstrated through the
course of the esophagram.
IMPRESSION: 1..  No mucosal irregularity, stricture, or mass demonstrated.
2.  Mildly patulous esophagus with mild dysmotility.
3.  No evidence obstruction at the fundoplication.
4.  Small diverticulum left adjacent to the mid esophagus on the
comparison CT  is not demonstrated on today's study and is of
questionable clinical significance.

## 2016-05-10 ENCOUNTER — Encounter: Payer: Self-pay | Admitting: Cardiology

## 2016-05-10 NOTE — Telephone Encounter (Signed)
This encounter was created in error - please disregard.

## 2016-05-10 NOTE — Telephone Encounter (Signed)
New Message     Did not received the paperwork for the Xarelto? Her prescription is almost out and can not afford to get refill

## 2019-09-01 ENCOUNTER — Telehealth: Payer: Self-pay | Admitting: Cardiology

## 2019-09-01 NOTE — Telephone Encounter (Signed)
Franca is calling requesting to speak with Deliah Goody in regards to amlodipine. She states she recieved a call yesterday in regards to it. Please advise.

## 2019-09-02 NOTE — Telephone Encounter (Signed)
Left message for pt I have not called about amlodipine. She will call back if needed.

## 2020-04-06 DEATH — deceased
